# Patient Record
Sex: Female | Born: 1980 | Race: Black or African American | Hispanic: No | Marital: Single | Smoking: Current every day smoker
Health system: Southern US, Community
[De-identification: ages and names within clinical notes are randomized; demographics above are authoritative.]

## PROBLEM LIST (undated history)

## (undated) DIAGNOSIS — G43909 Migraine, unspecified, not intractable, without status migrainosus: Secondary | ICD-10-CM

## (undated) DIAGNOSIS — R319 Hematuria, unspecified: Secondary | ICD-10-CM

## (undated) DIAGNOSIS — R351 Nocturia: Secondary | ICD-10-CM

## (undated) DIAGNOSIS — N200 Calculus of kidney: Secondary | ICD-10-CM

## (undated) DIAGNOSIS — R3915 Urgency of urination: Secondary | ICD-10-CM

## (undated) DIAGNOSIS — Z87442 Personal history of urinary calculi: Secondary | ICD-10-CM

## (undated) DIAGNOSIS — N201 Calculus of ureter: Secondary | ICD-10-CM

## (undated) DIAGNOSIS — M3500 Sicca syndrome, unspecified: Secondary | ICD-10-CM

---

## 2003-04-21 HISTORY — PX: CHOLECYSTECTOMY: SHX55

## 2004-04-20 HISTORY — PX: TUBAL LIGATION: SHX77

## 2010-04-20 HISTORY — PX: EXTRACORPOREAL SHOCK WAVE LITHOTRIPSY: SHX1557

## 2013-02-28 ENCOUNTER — Emergency Department (HOSPITAL_COMMUNITY): Payer: Medicaid Other

## 2013-02-28 ENCOUNTER — Emergency Department (HOSPITAL_COMMUNITY)
Admission: EM | Admit: 2013-02-28 | Discharge: 2013-02-28 | Disposition: A | Discharge status: Home / Self Care | Payer: Medicaid Other | Attending: Emergency Medicine | Admitting: Emergency Medicine

## 2013-02-28 ENCOUNTER — Telehealth (HOSPITAL_COMMUNITY): Payer: Self-pay | Admitting: *Deleted

## 2013-02-28 ENCOUNTER — Encounter (HOSPITAL_COMMUNITY): Payer: Self-pay | Admitting: Emergency Medicine

## 2013-02-28 DIAGNOSIS — S82899A Other fracture of unspecified lower leg, initial encounter for closed fracture: Secondary | ICD-10-CM | POA: Insufficient documentation

## 2013-02-28 DIAGNOSIS — S82401A Unspecified fracture of shaft of right fibula, initial encounter for closed fracture: Secondary | ICD-10-CM

## 2013-02-28 DIAGNOSIS — X500XXA Overexertion from strenuous movement or load, initial encounter: Secondary | ICD-10-CM | POA: Insufficient documentation

## 2013-02-28 DIAGNOSIS — Y929 Unspecified place or not applicable: Secondary | ICD-10-CM | POA: Insufficient documentation

## 2013-02-28 DIAGNOSIS — S82301D Unspecified fracture of lower end of right tibia, subsequent encounter for closed fracture with routine healing: Secondary | ICD-10-CM

## 2013-02-28 DIAGNOSIS — Y9302 Activity, running: Secondary | ICD-10-CM | POA: Insufficient documentation

## 2013-02-28 DIAGNOSIS — F17200 Nicotine dependence, unspecified, uncomplicated: Secondary | ICD-10-CM | POA: Insufficient documentation

## 2013-02-28 MED ORDER — IBUPROFEN 400 MG PO TABS
600.0000 mg | ORAL_TABLET | Freq: Once | ORAL | Status: AC
  Administered 2013-02-28: 600 mg via ORAL
  Filled 2013-02-28 (×2): qty 1

## 2013-02-28 MED ORDER — IBUPROFEN 600 MG PO TABS: 600.0000 mg | ORAL_TABLET | Freq: Four times a day (QID) | ORAL | Status: DC | PRN

## 2013-02-28 NOTE — ED Provider Notes (Signed)
CSN: [id number]     Arrival date & time 02/28/13  0848 History   First MD Initiated Contact with Patient 02/28/13 0857     Chief Complaint  Patient presents with  . Foot Pain   (Consider location/radiation/quality/duration/timing/severity/associated sxs/prior Treatment) Patient is a 32 y.o. female presenting with ankle pain. The history is provided by the patient.  Ankle Pain Location:  Ankle Time since incident:  4 days Lower extremity injury: running and twisted ankle.   Ankle location:  R ankle Pain details:    Quality:  Dull   Radiates to:  Does not radiate   Severity:  Moderate   Onset quality:  Sudden   Duration:  4 days   Timing:  Intermittent   Progression:  Waxing and waning Chronicity:  New Dislocation: no   Foreign body present:  No foreign bodies Prior injury to area:  Yes ("small fracture in july") Relieved by:  Ice and immobilization Worsened by:  Bearing weight Ineffective treatments:  None tried Associated symptoms: decreased ROM and swelling   Associated symptoms: no fever, no muscle weakness, no numbness, no stiffness and no tingling   Risk factors: no concern for non-accidental trauma     History reviewed. No pertinent past medical history. Past Surgical History  Procedure Laterality Date  . Cholecystectomy    . Tubal ligation     No family history on file. History  Substance Use Topics  . Smoking status: Current Every Day Smoker  . Smokeless tobacco: Not on file  . Alcohol Use: No   OB History   Grav Para Term Preterm Abortions TAB SAB Ect Mult Living                 Review of Systems  Constitutional: Negative for fever.  Musculoskeletal: Negative for stiffness.  All other systems reviewed and are negative.    Allergies  Review of patient's allergies indicates no known allergies.  Home Medications   Current Outpatient Rx  Name  Route  Sig  Dispense  Refill  . ibuprofen (ADVIL,MOTRIN) 200 MG tablet   Oral   Take 600-800 mg by  mouth every 6 (six) hours as needed for headache or mild pain.         Marland Kitchen ibuprofen (ADVIL,MOTRIN) 600 MG tablet   Oral   Take 1 tablet (600 mg total) by mouth every 6 (six) hours as needed for mild pain.   30 tablet   0    BP 101/56  Pulse 99  Temp(Src) 98.5 F (36.9 C) (Oral)  Resp 16  Wt 161 lb 8 oz (73.256 kg)  SpO2 100%  LMP 02/28/2013 Physical Exam  Nursing note and vitals reviewed. Constitutional: She is oriented to person, place, and time. She appears well-developed and well-nourished.  HENT:  Head: Normocephalic.  Right Ear: External ear normal.  Left Ear: External ear normal.  Nose: Nose normal.  Mouth/Throat: Oropharynx is clear and moist.  Eyes: EOM are normal. Pupils are equal, round, and reactive to light. Right eye exhibits no discharge. Left eye exhibits no discharge.  Neck: Normal range of motion. Neck supple. No tracheal deviation present.  No nuchal rigidity no meningeal signs  Cardiovascular: Normal rate and regular rhythm.   Pulmonary/Chest: Effort normal and breath sounds normal. No stridor. No respiratory distress. She has no wheezes. She has no rales.  Abdominal: Soft. She exhibits no distension and no mass. There is no tenderness. There is no rebound and no guarding.  Musculoskeletal: Normal range of motion.  She exhibits edema and tenderness.  Tenderness and edema over right lateral malleolus extending one third of the way  up distal tibia. Neurovascularly intact distally. No metatarsal tenderness. Full range of motion at ankle knee and hip.  Neurological: She is alert and oriented to person, place, and time. She has normal reflexes. No cranial nerve deficit. Coordination normal.  Skin: Skin is warm. No rash noted. She is not diaphoretic. No erythema. No pallor.  No pettechia no purpura    ED Course  Procedures (including critical care time) Labs Review Labs Reviewed - No data to display Imaging Review Dg Ankle Complete Right  02/28/2013    CLINICAL DATA:  Right ankle pain and swelling after injury.  EXAM: RIGHT ANKLE - COMPLETE 3+ VIEW  COMPARISON:  None.  FINDINGS: There is a small triangular bone fragment seen distal to the right fibula most consistent with old fracture, but acute pathology cannot be. No soft tissue swelling is noted. Joint spaces appear to be intact.  IMPRESSION: Small triangular bone fragment seen distal to right fibula which may represent old fracture, but acute pathology cannot be excluded and correlation is recommended.   Electronically Signed   By: Roque Lias M.D.   On: 02/28/2013 09:39    EKG Interpretation   None       MDM   1. Fibula fracture, right, closed, initial encounter    Will obtain x-rays to rule out fracture dislocation we'll give Motrin for pain patient agrees with plan  1010a small bone fragment likely related to fracture that occurred during the summer that patient describes. Patient is unable to bear weight at this time due to pain. I will place patient in splint and crutches and have orthopedic followup patient remains neurovascularly intact. Patient agrees with plan for discharge home.    Arley Phenix, MD 02/28/13 [phone]

## 2013-02-28 NOTE — ED Notes (Signed)
Patient was running on Friday,  She twisted her ankle.  She now has bruising and pain when moving and ambulating.  Last took ibuprofen last night.

## 2013-02-28 NOTE — ED Notes (Signed)
Pt calling for Authorization # MDs name that she saw in the ED.  I called [facility] [place] office and they said to tell her to call her PCP or Social worker with medicaid.

## 2013-02-28 NOTE — Progress Notes (Signed)
Orthopedic Tech Progress Note Patient Details:  Christine Bautista 01-06-1981 [id number]  Ortho Devices Type of Ortho Device: Stirrup splint;Post (short leg) splint;Crutches Ortho Device/Splint Interventions: Application   Cammer, Mickie Bail 02/28/2013, 10:31 AM

## 2013-05-05 ENCOUNTER — Encounter (HOSPITAL_COMMUNITY)
Admission: EM | Disposition: A | Discharge status: Home / Self Care | Payer: Self-pay | Source: Home / Self Care | Attending: Emergency Medicine

## 2013-05-05 ENCOUNTER — Emergency Department (HOSPITAL_COMMUNITY): Payer: Medicaid Other

## 2013-05-05 ENCOUNTER — Encounter (HOSPITAL_COMMUNITY): Payer: Medicaid Other | Admitting: Anesthesiology

## 2013-05-05 ENCOUNTER — Ambulatory Visit (HOSPITAL_COMMUNITY)
Admission: EM | Admit: 2013-05-05 | Discharge: 2013-05-05 | Disposition: A | Discharge status: Home / Self Care | Payer: Medicaid Other | Attending: Emergency Medicine | Admitting: Emergency Medicine

## 2013-05-05 ENCOUNTER — Emergency Department (HOSPITAL_COMMUNITY): Payer: Medicaid Other | Admitting: Anesthesiology

## 2013-05-05 ENCOUNTER — Encounter (HOSPITAL_COMMUNITY): Payer: Self-pay | Admitting: Emergency Medicine

## 2013-05-05 DIAGNOSIS — Z87442 Personal history of urinary calculi: Secondary | ICD-10-CM | POA: Insufficient documentation

## 2013-05-05 DIAGNOSIS — Z9089 Acquired absence of other organs: Secondary | ICD-10-CM | POA: Insufficient documentation

## 2013-05-05 DIAGNOSIS — R109 Unspecified abdominal pain: Secondary | ICD-10-CM | POA: Insufficient documentation

## 2013-05-05 DIAGNOSIS — Q615 Medullary cystic kidney: Secondary | ICD-10-CM | POA: Insufficient documentation

## 2013-05-05 DIAGNOSIS — Z79899 Other long term (current) drug therapy: Secondary | ICD-10-CM | POA: Insufficient documentation

## 2013-05-05 DIAGNOSIS — N201 Calculus of ureter: Secondary | ICD-10-CM | POA: Insufficient documentation

## 2013-05-05 DIAGNOSIS — N200 Calculus of kidney: Secondary | ICD-10-CM

## 2013-05-05 DIAGNOSIS — F17200 Nicotine dependence, unspecified, uncomplicated: Secondary | ICD-10-CM | POA: Insufficient documentation

## 2013-05-05 DIAGNOSIS — G43909 Migraine, unspecified, not intractable, without status migrainosus: Secondary | ICD-10-CM | POA: Insufficient documentation

## 2013-05-05 DIAGNOSIS — N1330 Unspecified hydronephrosis: Secondary | ICD-10-CM | POA: Insufficient documentation

## 2013-05-05 DIAGNOSIS — Z9851 Tubal ligation status: Secondary | ICD-10-CM | POA: Insufficient documentation

## 2013-05-05 HISTORY — PX: CYSTOSCOPY W/ URETERAL STENT PLACEMENT: SHX1429

## 2013-05-05 HISTORY — DX: Migraine, unspecified, not intractable, without status migrainosus: G43.909

## 2013-05-05 LAB — URINALYSIS, ROUTINE W REFLEX MICROSCOPIC
BILIRUBIN URINE: NEGATIVE
Glucose, UA: NEGATIVE mg/dL
Ketones, ur: NEGATIVE mg/dL
Nitrite: NEGATIVE
PROTEIN: 30 mg/dL — AB
Specific Gravity, Urine: 1.015 (ref 1.005–1.030)
Urobilinogen, UA: 1 mg/dL (ref 0.0–1.0)
pH: 7 (ref 5.0–8.0)

## 2013-05-05 LAB — CBC WITH DIFFERENTIAL/PLATELET
BASOS ABS: 0 10*3/uL (ref 0.0–0.1)
Basophils Relative: 0 % (ref 0–1)
Eosinophils Absolute: 0.1 10*3/uL (ref 0.0–0.7)
Eosinophils Relative: 1 % (ref 0–5)
HEMATOCRIT: 39.5 % (ref 36.0–46.0)
Hemoglobin: 13.3 g/dL (ref 12.0–15.0)
LYMPHS PCT: 15 % (ref 12–46)
Lymphs Abs: 1.3 10*3/uL (ref 0.7–4.0)
MCH: 26 pg (ref 26.0–34.0)
MCHC: 33.7 g/dL (ref 30.0–36.0)
MCV: 77.1 fL — ABNORMAL LOW (ref 78.0–100.0)
MONO ABS: 0.4 10*3/uL (ref 0.1–1.0)
Monocytes Relative: 5 % (ref 3–12)
NEUTROS ABS: 6.8 10*3/uL (ref 1.7–7.7)
Neutrophils Relative %: 80 % — ABNORMAL HIGH (ref 43–77)
Platelets: 120 10*3/uL — ABNORMAL LOW (ref 150–400)
RBC: 5.12 MIL/uL — ABNORMAL HIGH (ref 3.87–5.11)
RDW: 13.7 % (ref 11.5–15.5)
WBC: 8.5 10*3/uL (ref 4.0–10.5)

## 2013-05-05 LAB — COMPREHENSIVE METABOLIC PANEL
ALK PHOS: 70 U/L (ref 39–117)
ALT: 52 U/L — AB (ref 0–35)
AST: 35 U/L (ref 0–37)
Albumin: 3.6 g/dL (ref 3.5–5.2)
BILIRUBIN TOTAL: 0.2 mg/dL — AB (ref 0.3–1.2)
BUN: 12 mg/dL (ref 6–23)
CHLORIDE: 101 meq/L (ref 96–112)
CO2: 26 meq/L (ref 19–32)
Calcium: 9.4 mg/dL (ref 8.4–10.5)
Creatinine, Ser: 0.96 mg/dL (ref 0.50–1.10)
GFR calc Af Amer: >90 mL/min (ref >90)
GFR, EST NON AFRICAN AMERICAN: 78 mL/min — AB (ref >90)
Glucose, Bld: 94 mg/dL (ref 70–99)
Potassium: 4.2 mEq/L (ref 3.7–5.3)
Sodium: 139 mEq/L (ref 137–147)
Total Protein: 8.2 g/dL (ref 6.0–8.3)

## 2013-05-05 LAB — URINE MICROSCOPIC-ADD ON

## 2013-05-05 LAB — LIPASE, BLOOD: Lipase: 21 U/L (ref 11–59)

## 2013-05-05 LAB — OCCULT BLOOD, POC DEVICE: Fecal Occult Bld: NEGATIVE

## 2013-05-05 LAB — POCT PREGNANCY, URINE: Preg Test, Ur: NEGATIVE

## 2013-05-05 SURGERY — CYSTOSCOPY, WITH RETROGRADE PYELOGRAM AND URETERAL STENT INSERTION
Anesthesia: General | Site: Ureter | Laterality: Left

## 2013-05-05 MED ORDER — ONDANSETRON HCL 4 MG/2ML IJ SOLN
4.0000 mg | Freq: Once | INTRAMUSCULAR | Status: AC
  Administered 2013-05-05: 4 mg via INTRAVENOUS
  Filled 2013-05-05: qty 2

## 2013-05-05 MED ORDER — MORPHINE SULFATE 4 MG/ML IJ SOLN
4.0000 mg | Freq: Once | INTRAMUSCULAR | Status: AC
  Administered 2013-05-05: 4 mg via INTRAVENOUS
  Filled 2013-05-05: qty 1

## 2013-05-05 MED ORDER — BELLADONNA ALKALOIDS-OPIUM 16.2-60 MG RE SUPP
RECTAL | Status: DC | PRN
  Administered 2013-05-05: 1 via RECTAL

## 2013-05-05 MED ORDER — LACTATED RINGERS IV SOLN: INTRAVENOUS | Status: DC

## 2013-05-05 MED ORDER — CEFAZOLIN SODIUM-DEXTROSE 2-3 GM-% IV SOLR
2.0000 g | INTRAVENOUS | Status: AC
  Administered 2013-05-05: 2 g via INTRAVENOUS

## 2013-05-05 MED ORDER — OXYCODONE-ACETAMINOPHEN 5-325 MG PO TABS
ORAL_TABLET | ORAL | Status: AC
  Filled 2013-05-05: qty 1

## 2013-05-05 MED ORDER — FENTANYL CITRATE 0.05 MG/ML IJ SOLN
INTRAMUSCULAR | Status: AC
  Filled 2013-05-05: qty 2

## 2013-05-05 MED ORDER — IOHEXOL 300 MG/ML  SOLN
INTRAMUSCULAR | Status: DC | PRN
  Administered 2013-05-05: 4 mL via INTRAVENOUS

## 2013-05-05 MED ORDER — OXYBUTYNIN CHLORIDE 5 MG PO TABS: 5.0000 mg | ORAL_TABLET | Freq: Four times a day (QID) | ORAL | Status: DC | PRN

## 2013-05-05 MED ORDER — MIDAZOLAM HCL 2 MG/2ML IJ SOLN
INTRAMUSCULAR | Status: AC
  Filled 2013-05-05: qty 2

## 2013-05-05 MED ORDER — ONDANSETRON HCL 4 MG/2ML IJ SOLN
INTRAMUSCULAR | Status: AC
  Filled 2013-05-05: qty 2

## 2013-05-05 MED ORDER — OXYBUTYNIN CHLORIDE 5 MG PO TABS
ORAL_TABLET | ORAL | Status: AC
  Filled 2013-05-05: qty 1

## 2013-05-05 MED ORDER — SODIUM CHLORIDE 0.9 % IV SOLN
1000.0000 mL | Freq: Once | INTRAVENOUS | Status: AC
  Administered 2013-05-05: 1000 mL via INTRAVENOUS

## 2013-05-05 MED ORDER — OXYCODONE-ACETAMINOPHEN 5-325 MG PO TABS
1.0000 | ORAL_TABLET | ORAL | Status: DC | PRN
  Administered 2013-05-05: 1 via ORAL

## 2013-05-05 MED ORDER — KETOROLAC TROMETHAMINE 30 MG/ML IJ SOLN
30.0000 mg | Freq: Once | INTRAMUSCULAR | Status: AC
  Administered 2013-05-05: 30 mg via INTRAVENOUS

## 2013-05-05 MED ORDER — CEFAZOLIN SODIUM-DEXTROSE 2-3 GM-% IV SOLR
INTRAVENOUS | Status: AC
  Filled 2013-05-05: qty 50

## 2013-05-05 MED ORDER — MIDAZOLAM HCL 5 MG/5ML IJ SOLN
INTRAMUSCULAR | Status: DC | PRN
  Administered 2013-05-05: 2 mg via INTRAVENOUS

## 2013-05-05 MED ORDER — TAMSULOSIN HCL 0.4 MG PO CAPS: 0.4000 mg | ORAL_CAPSULE | Freq: Every day | ORAL | Status: DC

## 2013-05-05 MED ORDER — CIPROFLOXACIN HCL 500 MG PO TABS: 500.0000 mg | ORAL_TABLET | Freq: Two times a day (BID) | ORAL | Status: DC

## 2013-05-05 MED ORDER — PHENYLEPHRINE 40 MCG/ML (10ML) SYRINGE FOR IV PUSH (FOR BLOOD PRESSURE SUPPORT)
PREFILLED_SYRINGE | INTRAVENOUS | Status: AC
  Filled 2013-05-05: qty 10

## 2013-05-05 MED ORDER — FENTANYL CITRATE 0.05 MG/ML IJ SOLN
25.0000 ug | INTRAMUSCULAR | Status: DC | PRN
  Administered 2013-05-05 (×2): 25 ug via INTRAVENOUS

## 2013-05-05 MED ORDER — ACETAMINOPHEN 10 MG/ML IV SOLN
1000.0000 mg | Freq: Once | INTRAVENOUS | Status: AC
  Administered 2013-05-05: 1000 mg via INTRAVENOUS
  Filled 2013-05-05: qty 100

## 2013-05-05 MED ORDER — FENTANYL CITRATE 0.05 MG/ML IJ SOLN
INTRAMUSCULAR | Status: DC | PRN
  Administered 2013-05-05: 50 ug via INTRAVENOUS

## 2013-05-05 MED ORDER — PHENAZOPYRIDINE HCL 100 MG PO TABS: 100.0000 mg | ORAL_TABLET | Freq: Three times a day (TID) | ORAL | Status: DC | PRN

## 2013-05-05 MED ORDER — ONDANSETRON HCL 4 MG/2ML IJ SOLN
INTRAMUSCULAR | Status: DC | PRN
  Administered 2013-05-05: 4 mg via INTRAVENOUS

## 2013-05-05 MED ORDER — HYOSCYAMINE SULFATE 0.125 MG PO TABS: 0.1250 mg | ORAL_TABLET | ORAL | Status: DC | PRN

## 2013-05-05 MED ORDER — OXYCODONE-ACETAMINOPHEN 5-325 MG PO TABS: 1.0000 | ORAL_TABLET | ORAL | Status: DC | PRN

## 2013-05-05 MED ORDER — BELLADONNA ALKALOIDS-OPIUM 16.2-60 MG RE SUPP
RECTAL | Status: AC
  Filled 2013-05-05: qty 1

## 2013-05-05 MED ORDER — SENNOSIDES-DOCUSATE SODIUM 8.6-50 MG PO TABS: 1.0000 | ORAL_TABLET | Freq: Two times a day (BID) | ORAL | Status: DC

## 2013-05-05 MED ORDER — KETOROLAC TROMETHAMINE 30 MG/ML IJ SOLN
INTRAMUSCULAR | Status: AC
  Filled 2013-05-05: qty 1

## 2013-05-05 MED ORDER — PROPOFOL 10 MG/ML IV BOLUS
INTRAVENOUS | Status: DC | PRN
Start: 2013-05-05 — End: 2013-05-05
  Administered 2013-05-05: 100 mg via INTRAVENOUS

## 2013-05-05 MED ORDER — SODIUM CHLORIDE 0.9 % IR SOLN
Status: DC | PRN
  Administered 2013-05-05: 3000 mL via INTRAVESICAL

## 2013-05-05 MED ORDER — LIDOCAINE HCL 2 % EX GEL
CUTANEOUS | Status: AC
  Filled 2013-05-05: qty 10

## 2013-05-05 MED ORDER — PROPOFOL 10 MG/ML IV BOLUS
INTRAVENOUS | Status: AC
  Filled 2013-05-05: qty 20

## 2013-05-05 MED ORDER — LIDOCAINE HCL 2 % EX GEL
CUTANEOUS | Status: DC | PRN
  Administered 2013-05-05: 1

## 2013-05-05 MED ORDER — OXYBUTYNIN CHLORIDE 5 MG PO TABS
5.0000 mg | ORAL_TABLET | Freq: Once | ORAL | Status: AC
  Administered 2013-05-05: 5 mg via ORAL

## 2013-05-05 MED ORDER — PHENYLEPHRINE HCL 10 MG/ML IJ SOLN
INTRAMUSCULAR | Status: DC | PRN
  Administered 2013-05-05: 60 ug via INTRAVENOUS

## 2013-05-05 MED ORDER — LACTATED RINGERS IV SOLN
INTRAVENOUS | Status: DC | PRN
  Administered 2013-05-05: 17:00:00 via INTRAVENOUS

## 2013-05-05 SURGICAL SUPPLY — 16 items
BAG URO CATCHER STRL LF (DRAPE) ×2 IMPLANT
BASKET ZERO TIP NITINOL 2.4FR (BASKET) IMPLANT
CATH INTERMIT  6FR 70CM (CATHETERS) IMPLANT
CATH URET 5FR 28IN OPEN ENDED (CATHETERS) ×2 IMPLANT
DRAPE CAMERA CLOSED 9X96 (DRAPES) ×2 IMPLANT
GLOVE BIOGEL M 7.0 STRL (GLOVE) ×2 IMPLANT
GLOVE BIOGEL PI IND STRL 7.0 (GLOVE) ×2 IMPLANT
GLOVE BIOGEL PI INDICATOR 7.0 (GLOVE) ×2
GOWN STRL REUS W/TWL LRG LVL3 (GOWN DISPOSABLE) ×2 IMPLANT
GUIDEWIRE ANG ZIPWIRE 038X150 (WIRE) IMPLANT
GUIDEWIRE STR DUAL SENSOR (WIRE) ×4 IMPLANT
MANIFOLD NEPTUNE II (INSTRUMENTS) ×2 IMPLANT
PACK CYSTO (CUSTOM PROCEDURE TRAY) ×2 IMPLANT
SCRUB PCMX 4 OZ (MISCELLANEOUS) ×2 IMPLANT
STENT POLARIS 5FRX24 (STENTS) ×2 IMPLANT
TUBING CONNECTING 10 (TUBING) ×2 IMPLANT

## 2013-05-05 NOTE — ED Provider Notes (Signed)
CSN: [id number]     Arrival date & time 05/05/13  0944 History   First MD Initiated Contact with Patient 05/05/13 1008     Chief Complaint  Patient presents with  . flu like symptoms    (Consider location/radiation/quality/duration/timing/severity/associated sxs/prior Treatment) HPI  33 year old female with history of migraine, history of pancreatitis and hx of Sjogren syndrome who presents to the ED complaining of "not feeling well". Patient reports she normally has migraine once every 2 weeks which she takes Maxalt. She recently moved to this area and recently established care with a new provider. This Monday she developed pain to the back of the head and neck, which is usual for migraine. She followup with her new provider after Maxalt is not providing relief. Patient was given Phenergan and Toradol shot which has helped with the headache. She was recommended to take Topamax daily for headache. However throughout the week she developed body aches, feeling tired, sleepy, headache, nausea and has vomited to 3 times of yellow stomach contents. She also reports not having any bowel movement for the past 3 days. She normally would have 2-3 bouts of bowel movement per day. She denies having any URI symptoms including no running nose sneezing coughing. She denies chest pain or shortness of breath. Denies abdominal pain or constipation. Decrease in appetite. Patient reports due to the symptoms her work place has sent her home, therefore she would like to be evaluated to find out the cause of her sxs.  She also report having a recent strep test and flu test within the past several days but it was negative.  No hx of alcohol abuse or diabetes.    Past Medical History  Diagnosis Date  . Migraines    Past Surgical History  Procedure Laterality Date  . Cholecystectomy    . Tubal ligation     No family history on file. History  Substance Use Topics  . Smoking status: Current Every Day Smoker  .  Smokeless tobacco: Not on file  . Alcohol Use: No   OB History   Grav Para Term Preterm Abortions TAB SAB Ect Mult Living                 Review of Systems  Constitutional: Negative for fever.  All other systems reviewed and are negative.    Allergies  Review of patient's allergies indicates no known allergies.  Home Medications   Current Outpatient Rx  Name  Route  Sig  Dispense  Refill  . aspirin-acetaminophen-caffeine (EXCEDRIN MIGRAINE) 250-250-65 MG per tablet   Oral   Take 2 tablets by mouth every 6 (six) hours as needed for headache.         . ibuprofen (ADVIL,MOTRIN) 200 MG tablet   Oral   Take 600-800 mg by mouth every 6 (six) hours as needed for headache or mild pain.         . promethazine (PHENERGAN) 25 MG tablet   Oral   Take 25 mg by mouth every 6 (six) hours as needed for nausea.          . rizatriptan (MAXALT) 10 MG tablet   Oral   Take 10 mg by mouth as needed for migraine. May repeat in 2 hours if needed         . SUMAtriptan (IMITREX) 100 MG tablet   Oral   Take 100 mg by mouth daily as needed for migraine.          . topiramate (TOPAMAX)  25 MG tablet   Oral   Take 1 tablet by mouth 2 (two) times daily.         . traMADol (ULTRAM) 50 MG tablet   Oral   Take 50 mg by mouth every 6 (six) hours as needed for moderate pain.           BP 126/75  Pulse 108  Temp(Src) 98.5 F (36.9 C) (Oral)  Resp 18  SpO2 100%  LMP 04/18/2013 Physical Exam  Nursing note and vitals reviewed. Constitutional: She appears well-developed and well-nourished. No distress.  HENT:  Head: Atraumatic.  Ears: Normal TMs bilaterally  Nose: normal nares  Throat: uvula midline, no trismus, no evidence of deep tissue infection. Normal tonsils.      Eyes: Conjunctivae are normal.  Neck: Neck supple.  No nuchal rigidity  Cardiovascular:  Tachycardia without murmurs, rubs, or gallops noted  Pulmonary/Chest: Effort normal and breath sounds normal.   Abdominal: She exhibits no distension. There is tenderness (Mild generalized abdominal discomfort most significant to the epigastric and right upper quadrant without rebound ). There is no rebound and no guarding.  Genitourinary:  L CVA tenderness  Neurological: She is alert.  Skin: No rash noted.  Psychiatric: She has a normal mood and affect.    ED Course  Procedures (including critical care time)  10:40 AM Patient here with generalized body aches and symptoms of viral infection but no significant URI symptoms. No acute onset thunderclap headache concerning for subarachnoid hemorrhage, no focal neuro deficit concerning for stroke, no fever, nuchal rigidity, or meningismal sign concerning for meningitis. Patient does report having nausea and vomiting fall with constipation. She has had prior abdominal surgery including cholecystectomy and tubal ligation. Will obtain acute abdominal series to rule out small bowel obstruction. We'll give IV fluid, that her nausea and pain medication and we'll check labs.  10:48 AM Pt report hx of pancreatitis with unknown sources (no hx of alcohol abuse, no hx of diabetes, no medication changes).  Does report hx of Sjogren, which i suspect may be related to her pancreatitis.    3:40 PM Pt with hx of kidney stones as well, requiring lithotripsy in the past.  She does have L CVA tenderness and blood in her urine with many bacteria.  CT shows 4 calculi within the proximal to mid left ureter, largest measuring 6.315mm with moderate obstructive uropathy.  Will consult urology for further management.    3:50 PM I have consulted with urologist Dr. Margarita GrizzleWoodruff, who agrees to see pt in ER, and likely treat pt with stenting.  Last meal was last night.  Will make pt npo.    4:12 PM Dr. Margarita GrizzleWoodruff has seen and evaluate pt.  He will take pt to the OR in 2-3 hrs for stenting and will dispo pt afterward.  He does not think pt warranted an admission at this time and request to  continue with care management until stenting.  Will continue to monitor pt.     Labs Review Labs Reviewed  CBC WITH DIFFERENTIAL - Abnormal; Notable for the following:    RBC 5.12 (*)    MCV 77.1 (*)    Platelets 120 (*)    Neutrophils Relative % 80 (*)    All other components within normal limits  COMPREHENSIVE METABOLIC PANEL - Abnormal; Notable for the following:    ALT 52 (*)    Total Bilirubin 0.2 (*)    GFR calc non Af Amer 78 (*)  All other components within normal limits  URINALYSIS, ROUTINE W REFLEX MICROSCOPIC - Abnormal; Notable for the following:    APPearance CLOUDY (*)    Hgb urine dipstick LARGE (*)    Protein, ur 30 (*)    Leukocytes, UA TRACE (*)    All other components within normal limits  URINE MICROSCOPIC-ADD ON - Abnormal; Notable for the following:    Squamous Epithelial / LPF FEW (*)    Bacteria, UA MANY (*)    All other components within normal limits  URINE CULTURE  LIPASE, BLOOD  POCT PREGNANCY, URINE  OCCULT BLOOD, POC DEVICE   Imaging Review Ct Abdomen Pelvis Wo Contrast  05/05/2013   CLINICAL DATA:  Left flank pain.  EXAM: CT ABDOMEN AND PELVIS WITHOUT CONTRAST  TECHNIQUE: Multidetector CT imaging of the abdomen and pelvis was performed following the standard protocol without intravenous contrast.  COMPARISON:  None.  FINDINGS: Hypoventilation is appreciated within the lung bases.  Evaluation of the left kidney demonstrates diffuse regions of medullary calcinosis. Moderate hydronephrosis is identified. Within the proximal left ureter, approximately 4 calculi are identified. The largest focus appears to measure 6.5 mm in craniocaudal dimensions image 79 series 5. The ureter is decompressed distal to the calculi. Regions of medullary calcinosis are identified within the right kidney no evidence of hydronephrosis nor ureteral lithiasis. Small punctate medullary calculi are also identified within the right kidney largest in the anterior lower pole  measuring 3.8 mm in diameter.  Noncontrast evaluation of the liver, spleen, adrenals, pancreas is unremarkable.  There is no evidence of abdominal or pelvic free fluid, loculated fluid collections, masses, nor adenopathy. There is no evidence of bowel obstruction, enteritis, colitis, nor diverticulitis. Unenhanced CT was performed per clinician order. Lack of IV contrast limits sensitivity and specificity, especially for evaluation of abdominal/pelvic solid viscera.  Patient status post cholecystectomy.  There is no evidence of abdominal aortic aneurysm.  Findings consistent with patient's history of bilateral tubal ligation within the pelvis. Surgical clips present.  IMPRESSION: 1. Approximately 4 calculi within the proximal to mid left ureter, largest focus measuring 6.5 mm in craniocaudal dimensions, with associated moderate obstructive uropathy. 2. 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   Electronically Signed   By: Salome Holmes M.D.   On: 05/05/2013 13:33   Dg Abd Acute W/chest  05/05/2013   CLINICAL DATA:  Rule out small bowel obstruction  EXAM: ACUTE ABDOMEN SERIES (ABDOMEN 2 VIEW & CHEST 1 VIEW)  COMPARISON:  None.  FINDINGS: The lungs are clear.  Heart size is normal.  No effusion  Normal bowel gas pattern. Mild amount of stool throughout the colon. No air-fluid levels or free air.  Surgical clips in the gallbladder fossa. Bilateral adnexal clips are present.  5 x 7 mm calcification to the left of L3-4 could be within the left ureter. Correlate with left flank pain. Right upper quadrant calcifications may be in the right kidney or surrounding soft tissues.  IMPRESSION: No active cardiopulmonary abnormality.  Negative for bowel obstruction.  5 x 7 mm calcification possibly in the mid left ureter.   Electronically Signed   By: Marlan Palau M.D.   On: 05/05/2013 11:39    EKG Interpretation   None       MDM   1. Kidney stone on left side    BP 121/84   Pulse 83  Temp(Src) 98.5 F (36.9 C) (Oral)  Resp 14  SpO2 100%  LMP 04/18/2013  I  have reviewed nursing notes and vital signs. I personally reviewed the imaging tests through PACS system  I reviewed available ER/hospitalization records thought the EMR     Fayrene Helper, [place] 05/06/13 1008

## 2013-05-05 NOTE — ED Notes (Signed)
Bed: WA03 Expected date:  Expected time:  Means of arrival:  Comments: 

## 2013-05-05 NOTE — Discharge Instructions (Signed)
DISCHARGE INSTRUCTIONS FOR KIDNEY STONES OR URETERAL STENT ° °MEDICATIONS:  ° °1.  Resume all your other meds from home. ° °ACTIVITY °1. No strenuous activity x 1week °2. No driving while on narcotic pain medications °3. Drink plenty of water °4. Continue to walk at home - you can still get blood clots when you are at home, so keep active, but don't over do it. °5. May return to work in 3 days. ° °BATHING °1. You can shower or take a bath. ° ° ° °SIGNS/SYMPTOMS TO CALL: °1. Please call us if you have a fever greater than 101.5, uncontrolled  °nausea/vomiting, uncontrolled pain, dizziness, unable to urinate, chest pain, shortness of breath, leg swelling, leg pain, redness around wound, drainage from wound, or any other concerns or questions. ° °You can reach us at [phone]. ° ° °

## 2013-05-05 NOTE — H&P (Signed)
Urology H&P (Consult)  Requesting provider:  Fayrene HelperBowie Tran, PA  CC: Left ureter stones.  HPI: 33 year old female presents to the ER with left ureter stones. This is associated with left flank pain. It is also associated with severe nausea. She denies fever or gross hematuria. She has a history of kidney stones and the images appear consistent with someone with medullary sponge kidney. The patient states that she has been told she has medullary sponge kidney. She's been treated for stones before with shockwave lithotripsy. She recently moved to this area. UA does not look concerning for infection, but a culture has been sent just to be sure. She had a CT scan of the abdomen and pelvis without contrast after was confirmed that her pregnancy test was negative. This reveals calcifications in the kidneys bilaterally. There is left hydroureter with proximal hydronephrosis. There released 2 stones in the left ureter. The largest is proximally 7 mm in size.  Today we discussed the management of obstructing urinary stones. These options include observation with pain control, percutaneous nephrostomy tube, or cystoscopy with retrograde ureteral stent placement with possible ureteroscopy and laser lithotripsy.  We discussed the risks, benefits, alternatives and likelihood of achieving the patient's goals.  We discussed which options are relevant to this particular situation. We discussed the natural history of stones and the as well as the complications of untreated stones and the impact on quality of life without treatment as well as with each of the above listed treatments. We also discussed the efficacy of each treatment. With any of these management options I discussed the signs and symptoms of infection and the need for emergent treatment should these be experienced.   For observation I described the risks which include, but are not limited to, silent renal damage, life-threatening infection, need for emergent  surgery, failure to pass stone, and pain.  For stent placement with or without ureteroscopy and lithotripsy I described the risks which include, but are not limited to, heart attack, stroke, pulmonary embolus, death, bleeding, infection, damage to contiguous structures, positioning injury, ureteral stricture, ureteral avulsion, ureteral injury, need for ureteral stent, inability to perform ureteroscopy, need for an interval procedure, inability to clear stone burden, stent discomfort and pain.  For percutaneous nephrostomy tube I described the risks which including, but are not limited to, heart attack, stroke, pulmonary embolus, death, arterial venous fistula or malformation, need for embolization of kidney, loss of kidney or renal function.  The patient had the opportunity to ask questions to their stated satisfaction and voiced understanding.   Regarding her options for definitive treatment of the left ureter stones we discussed several options.  Today we discussed the management of urinary stones. These options include observation, ureteroscopy, shockwave lithotripsy, and PCNL. We discussed which options are relevant to these particular stones. We discussed the natural history of stones as well as the complications of untreated stones and the impact on quality of life without treatment as well as with each of the above listed treatments. We also discussed the efficacy of each treatment in its ability to clear the stone burden. With any of these management options I discussed the signs and symptoms of infection and the need for emergent treatment should these be experienced. For each option we discussed the ability of each procedure to clear the patient of their stone burden.  For observation I described the risks which include but are not limited to silent renal damage, life-threatening infection, need for emergent surgery, failure to pass  stone, and pain.  For ureteroscopy I described the risks  which include heart attack, stroke, pulmonary embolus, death, bleeding, infection, damage to contiguous structures, positioning injury, ureteral stricture, ureteral avulsion, ureteral injury, need for ureteral stent, inability to perform ureteroscopy, need for an interval procedure, inability to clear stone burden, stent discomfort and pain.  For shockwave lithotripsy I described the risks which include arrhythmia, kidney contusion, kidney hemorrhage, need for transfusion, long-term risk of diabetes or hypertension, back discomfort, flank ecchymosis, flank abrasion, inability to break up stone, inability to pass stone fragments, Steinstrasse, infection associated with obstructing stones, need for different surgical procedure, need for repeat shockwave lithotripsy, and death.    PMH: Past Medical History  Diagnosis Date  . Migraines     PSH: Past Surgical History  Procedure Laterality Date  . Cholecystectomy    . Tubal ligation      Allergies: No Known Allergies  Medications:  (Not in a hospital admission)   Social History: History   Social History  . Marital Status: Single    Spouse Name: N/A    Number of Children: N/A  . Years of Education: N/A   Occupational History  . Not on file.   Social History Main Topics  . Smoking status: Current Every Day Smoker  . Smokeless tobacco: Not on file  . Alcohol Use: No  . Drug Use: No  . Sexual Activity: Not on file   Other Topics Concern  . Not on file   Social History Narrative  . No narrative on file    Family History: No family history on file.  Review of Systems: Positive: Nausea, emesis, HA, body ache. Negative: Chest pain, SOB, fever.  A further 10 point review of systems was negative except what is listed in the HPI.  Physical Exam: Filed Vitals:   05/05/13 1626  BP: 104/75  Pulse: 86  Temp:   Resp: 14    General: No acute distress.  Awake. Head:  Normocephalic.  Atraumatic. ENT:  EOMI.  Mucous  membranes moist Neck:  Supple.  No lymphadenopathy. CV:  S1 present. S2 present. Regular rate. Pulmonary: Equal effort bilaterally.  Clear to auscultation bilaterally. Abdomen: Soft.  Non- tender to palpation. Skin:  Normal turgor.  No visible rash. Extremity: No gross deformity of bilateral upper extremities.  No gross deformity of    bilateral lower extremities. Neurologic: Alert. Appropriate mood.    Studies:  Recent Labs     05/05/13  1045  HGB  13.3  WBC  8.5  PLT  120*    Recent Labs     05/05/13  1045  NA  139  K  4.2  CL  101  CO2  26  BUN  12  CREATININE  0.96  CALCIUM  9.4  GFRNONAA  78*  GFRAA  >90     No results found for this basename: PT, INR, APTT,  in the last 72 hours   No components found with this basename: ABG,     Assessment:  Left ureter stones.  Plan: To OR for cystoscopy, left retrograde pyelogram, left ureter stent placement.  Ancef on call to OR.  Given her history of possible bleeding disorder I recommend cystoscopy, left ureteroscopy, laser lithotripsy, left retrograde PolyGram, and left ureter stent exchange in an interval fashion. We have discussed the risk and benefits as listed in the history of present illness.    Pager: [phone]    CC: Fayrene Helper, PA

## 2013-05-05 NOTE — Transfer of Care (Signed)
Immediate Anesthesia Transfer of Care Note  Patient: Christine Bautista  Procedure(s) Performed: Procedure(s): CYSTOSCOPY WITH RETROGRADE PYELOGRAM/URETERAL STENT PLACEMENT LEFT (Left)  Patient Location: PACU  Anesthesia Type:General  Level of Consciousness: awake, oriented, sedated and patient cooperative  Airway & Oxygen Therapy: Patient Spontanous Breathing and Patient connected to face mask oxygen  Post-op Assessment: Report given to PACU RN and Post -op Vital signs reviewed and stable  Post vital signs: Reviewed and stable  Complications: No apparent anesthesia complications

## 2013-05-05 NOTE — ED Notes (Addendum)
Per pt, doesn't feel like self-body aches, nausea, headache, vomiting-no BM since Tues, no fever-saw PCP on Tuesday and was treated for migraine

## 2013-05-05 NOTE — Op Note (Signed)
Urology Operative Report  Date of Procedure: 05/05/13  Surgeon: Natalia Leatherwoodaniel Maximos Zayas, MD Assistant:  None  Preoperative Diagnosis: Left ureter stones. Postoperative Diagnosis:  Same  Procedure(s): Left ureter stent placement (5 x 24) Left retrograde pyelogram with interpretation. Cystoscopy  Estimated blood loss: None  Specimen: None  Drains: None  Complications: None  Findings: Impacted mid to proximal left ureter stones. Negative bladder tumors.  History of present illness: Patient with a history of kidney stones. Presents to the ER with left-sided flank pain. CT revealed multiple stones in the proximal mid ureter. She elected to have a stent placement today.   Procedure in detail: After informed consent was obtained, the patient was taken to the operating room. They were placed in the supine position. SCDs were turned on and in place. IV antibiotics were infused, and general anesthesia was induced. A timeout was performed in which the correct patient, surgical site, and procedure were identified and agreed upon by the team.  The patient was placed in a dorsolithotomy position, making sure to pad all pertinent neurovascular pressure points. The genitals were prepped and draped in the usual sterile fashion.  A rigid cystoscope was advanced through the urethra and into the bladder. The bladder was then drained. The bladder was then fully distended and evaluated in a systematic fashion to visualize the entire surface of the bladder. This was negative for bladder tumors.  Attention was turned to the left ureter orifice. I did not note any effluxed from this orifice. It was cannulated with a 5 [place] ureter catheter and I injected 20 cc of Omnipaque to obtain a retrograde pyelogram. The ureter filled out well to the mid proximal ureter where there was a filling defect. There was very little passage of contrast beyond this but eventually contrast did pass. It would not fill out the entire  collecting system but there appeared to be hydronephrosis with a distended renal pelvis.  I then placed a sensor wire up the left ureter, beyond the stone, and into the left renal pelvis on fluoroscopy. I then placed a 5 x 24 Polaris stent without the strings over the wire. This was placed with ease. A good curl was deployed in the left renal pelvis and the loops were noted in the bladder. The bladder was then drained.  I placed lidocaine jelly into the urethra and then a belladonna and opium suppository to the rectum. She's placed back in a supine position, anesthesia was reversed, and [facility]  All counts were correct at the end of the case.  I will give her ciprofloxacin to cover for this mentation. She will be scheduled to return for ureteroscopy and 1-2 weeks.

## 2013-05-05 NOTE — Anesthesia Postprocedure Evaluation (Signed)
  Anesthesia Post-op Note  Patient: Christine PointerMaurissa Foote  Procedure(s) Performed: Procedure(s) (LRB): CYSTOSCOPY WITH RETROGRADE PYELOGRAM/URETERAL STENT PLACEMENT LEFT (Left)  Patient Location: PACU  Anesthesia Type: General  Level of Consciousness: awake and alert   Airway and Oxygen Therapy: Patient Spontanous Breathing  Post-op Pain: mild  Post-op Assessment: Post-op Vital signs reviewed, Patient's Cardiovascular Status Stable, Respiratory Function Stable, Patent Airway and No signs of Nausea or vomiting  Last Vitals:  Filed Vitals:   05/05/13 1915  BP: 104/79  Pulse: 57  Temp: 36.7 C  Resp: 14    Post-op Vital Signs: stable   Complications: No apparent anesthesia complications

## 2013-05-05 NOTE — ED Notes (Signed)
Pt transported to OR

## 2013-05-05 NOTE — Anesthesia Preprocedure Evaluation (Signed)
Anesthesia Evaluation  Patient identified by MRN, date of birth, ID band Patient awake    Reviewed: Allergy & Precautions, H&P , NPO status , Patient's Chart, lab work & pertinent test results  Airway Mallampati: II TM Distance: >3 FB Neck ROM: Full    Dental no notable dental hx. (+) Teeth Intact and Dental Advisory Given   Pulmonary neg pulmonary ROS, Current Smoker,  breath sounds clear to auscultation  Pulmonary exam normal       Cardiovascular Exercise Tolerance: Good negative cardio ROS  Rhythm:Regular Rate:Normal     Neuro/Psych  Headaches, negative neurological ROS  negative psych ROS   GI/Hepatic negative GI ROS, Neg liver ROS,   Endo/Other  negative endocrine ROS  Renal/GU negative Renal ROS  negative genitourinary   Musculoskeletal   Abdominal   Peds  Hematology negative hematology ROS (+)   Anesthesia Other Findings   Reproductive/Obstetrics negative OB ROS                           Anesthesia Physical Anesthesia Plan  ASA: II  Anesthesia Plan: General   Post-op Pain Management:    Induction: Intravenous  Airway Management Planned: LMA  Additional Equipment:   Intra-op Plan:   Post-operative Plan:   Informed Consent: I have reviewed the patients History and Physical, chart, labs and discussed the procedure including the risks, benefits and alternatives for the proposed anesthesia with the patient or authorized representative who has indicated his/her understanding and acceptance.   Dental Advisory Given  Plan Discussed with: Surgeon  Anesthesia Plan Comments:         Anesthesia Quick Evaluation

## 2013-05-06 LAB — URINE CULTURE

## 2013-05-08 ENCOUNTER — Emergency Department (HOSPITAL_COMMUNITY): Payer: Medicaid Other

## 2013-05-08 ENCOUNTER — Encounter (HOSPITAL_COMMUNITY): Payer: Self-pay | Admitting: Urology

## 2013-05-08 ENCOUNTER — Emergency Department (HOSPITAL_COMMUNITY)
Admission: EM | Admit: 2013-05-08 | Discharge: 2013-05-08 | Disposition: A | Discharge status: Home / Self Care | Payer: Medicaid Other | Attending: Emergency Medicine | Admitting: Emergency Medicine

## 2013-05-08 DIAGNOSIS — R110 Nausea: Secondary | ICD-10-CM | POA: Insufficient documentation

## 2013-05-08 DIAGNOSIS — Z79899 Other long term (current) drug therapy: Secondary | ICD-10-CM | POA: Insufficient documentation

## 2013-05-08 DIAGNOSIS — Z8739 Personal history of other diseases of the musculoskeletal system and connective tissue: Secondary | ICD-10-CM | POA: Insufficient documentation

## 2013-05-08 DIAGNOSIS — G43909 Migraine, unspecified, not intractable, without status migrainosus: Secondary | ICD-10-CM | POA: Insufficient documentation

## 2013-05-08 DIAGNOSIS — F17200 Nicotine dependence, unspecified, uncomplicated: Secondary | ICD-10-CM | POA: Insufficient documentation

## 2013-05-08 DIAGNOSIS — J029 Acute pharyngitis, unspecified: Secondary | ICD-10-CM | POA: Insufficient documentation

## 2013-05-08 DIAGNOSIS — Z792 Long term (current) use of antibiotics: Secondary | ICD-10-CM | POA: Insufficient documentation

## 2013-05-08 DIAGNOSIS — Z9889 Other specified postprocedural states: Secondary | ICD-10-CM | POA: Insufficient documentation

## 2013-05-08 DIAGNOSIS — Z9851 Tubal ligation status: Secondary | ICD-10-CM | POA: Insufficient documentation

## 2013-05-08 DIAGNOSIS — N201 Calculus of ureter: Secondary | ICD-10-CM | POA: Insufficient documentation

## 2013-05-08 DIAGNOSIS — Z9089 Acquired absence of other organs: Secondary | ICD-10-CM | POA: Insufficient documentation

## 2013-05-08 DIAGNOSIS — R319 Hematuria, unspecified: Secondary | ICD-10-CM | POA: Insufficient documentation

## 2013-05-08 HISTORY — DX: Sjogren syndrome, unspecified: M35.00

## 2013-05-08 LAB — URINALYSIS, ROUTINE W REFLEX MICROSCOPIC
BILIRUBIN URINE: NEGATIVE
Glucose, UA: NEGATIVE mg/dL
KETONES UR: NEGATIVE mg/dL
NITRITE: NEGATIVE
Protein, ur: 100 mg/dL — AB
SPECIFIC GRAVITY, URINE: 1.012 (ref 1.005–1.030)
UROBILINOGEN UA: 1 mg/dL (ref 0.0–1.0)
pH: 7 (ref 5.0–8.0)

## 2013-05-08 LAB — CBC WITH DIFFERENTIAL/PLATELET
BASOS ABS: 0 10*3/uL (ref 0.0–0.1)
BASOS PCT: 0 % (ref 0–1)
Eosinophils Absolute: 0.1 10*3/uL (ref 0.0–0.7)
Eosinophils Relative: 1 % (ref 0–5)
HCT: 34.6 % — ABNORMAL LOW (ref 36.0–46.0)
Hemoglobin: 11.6 g/dL — ABNORMAL LOW (ref 12.0–15.0)
Lymphocytes Relative: 16 % (ref 12–46)
Lymphs Abs: 1 10*3/uL (ref 0.7–4.0)
MCH: 26.1 pg (ref 26.0–34.0)
MCHC: 33.5 g/dL (ref 30.0–36.0)
MCV: 77.8 fL — ABNORMAL LOW (ref 78.0–100.0)
Monocytes Absolute: 0.3 10*3/uL (ref 0.1–1.0)
Monocytes Relative: 5 % (ref 3–12)
NEUTROS PCT: 78 % — AB (ref 43–77)
Neutro Abs: 4.8 10*3/uL (ref 1.7–7.7)
PLATELETS: 135 10*3/uL — AB (ref 150–400)
RBC: 4.45 MIL/uL (ref 3.87–5.11)
RDW: 13.6 % (ref 11.5–15.5)
WBC: 6.2 10*3/uL (ref 4.0–10.5)

## 2013-05-08 LAB — POCT I-STAT, CHEM 8
BUN: 10 mg/dL (ref 6–23)
Calcium, Ion: 1.2 mmol/L (ref 1.12–1.23)
Chloride: 102 mEq/L (ref 96–112)
Creatinine, Ser: 0.9 mg/dL (ref 0.50–1.10)
Glucose, Bld: 92 mg/dL (ref 70–99)
HEMATOCRIT: 36 % (ref 36.0–46.0)
HEMOGLOBIN: 12.2 g/dL (ref 12.0–15.0)
Potassium: 3.5 mEq/L — ABNORMAL LOW (ref 3.7–5.3)
SODIUM: 140 meq/L (ref 137–147)
TCO2: 25 mmol/L (ref 0–100)

## 2013-05-08 LAB — URINE MICROSCOPIC-ADD ON

## 2013-05-08 LAB — RAPID STREP SCREEN (MED CTR MEBANE ONLY): STREPTOCOCCUS, GROUP A SCREEN (DIRECT): NEGATIVE

## 2013-05-08 MED ORDER — HYDROMORPHONE HCL PF 1 MG/ML IJ SOLN
1.0000 mg | Freq: Once | INTRAMUSCULAR | Status: AC
  Administered 2013-05-08: 1 mg via INTRAVENOUS
  Filled 2013-05-08: qty 1

## 2013-05-08 MED ORDER — CIPROFLOXACIN HCL 500 MG PO TABS: 500.0000 mg | ORAL_TABLET | Freq: Two times a day (BID) | ORAL | Status: DC

## 2013-05-08 MED ORDER — HYDROMORPHONE HCL 2 MG PO TABS: 2.0000 mg | ORAL_TABLET | ORAL | Status: DC | PRN

## 2013-05-08 MED ORDER — SODIUM CHLORIDE 0.9 % IV BOLUS (SEPSIS)
500.0000 mL | Freq: Once | INTRAVENOUS | Status: AC
  Administered 2013-05-08: 500 mL via INTRAVENOUS

## 2013-05-08 MED ORDER — ONDANSETRON HCL 4 MG/2ML IJ SOLN
4.0000 mg | Freq: Once | INTRAMUSCULAR | Status: AC
  Administered 2013-05-08: 4 mg via INTRAVENOUS
  Filled 2013-05-08: qty 2

## 2013-05-08 NOTE — ED Provider Notes (Signed)
CSN: [id number]     Arrival date & time 05/08/13  1657 History   First MD Initiated Contact with Patient 05/08/13 1737     Chief Complaint  Patient presents with  . Hematuria  . Nephrolithiasis   (Consider location/radiation/quality/duration/timing/severity/associated sxs/prior Treatment) Patient is a 33 y.o. female presenting with hematuria. The history is provided by the patient.  Hematuria This is a new problem. Pertinent negatives include no chest pain, no abdominal pain, no headaches and no shortness of breath.   patient had a left-sided ureteral stone and had a stent placed 3 days ago. She states she's been doing worse since. She states she's had blood in the urine including blood clots. States she's had left flank and lower abdominal pain. She states it also hurts to swallow. She states she feels worse than she did before the procedure. She denies fevers. She's had mild nausea. She states she was told to be able to go back to work and does not feel it should be noted that now. She states she feels some cramping she feels maybe in her vagina or in that area.  Past Medical History  Diagnosis Date  . Migraines   . Kidney stones   . Blood disorder   . Sjogren's disease   . Pancreatitis    Past Surgical History  Procedure Laterality Date  . Cholecystectomy    . Tubal ligation    . Cystoscopy w/ ureteral stent placement Left 05/05/2013    Procedure: CYSTOSCOPY WITH RETROGRADE PYELOGRAM/URETERAL STENT PLACEMENT LEFT;  Surgeon: Milford Cage, MD;  Location: [facility];  Service: Urology;  Laterality: Left;   Family History  Problem Relation Age of Onset  . Hepatitis C Mother   . Hypertension Father   . Hyperlipidemia Father   . Asthma Brother    History  Substance Use Topics  . Smoking status: Current Every Day Smoker    Types: Cigars  . Smokeless tobacco: Never Used  . Alcohol Use: No   OB History   Grav Para Term Preterm Abortions TAB SAB Ect Mult Living                  Review of Systems  Constitutional: Negative for activity change and appetite change.  HENT: Positive for sore throat and trouble swallowing.   Eyes: Negative for pain.  Respiratory: Negative for chest tightness and shortness of breath.   Cardiovascular: Negative for chest pain and leg swelling.  Gastrointestinal: Positive for nausea. Negative for vomiting, abdominal pain and diarrhea.  Genitourinary: Positive for hematuria and flank pain. Negative for vaginal bleeding.  Musculoskeletal: Negative for back pain and neck stiffness.  Skin: Negative for rash.  Neurological: Negative for weakness, numbness and headaches.  Psychiatric/Behavioral: Negative for behavioral problems.    Allergies  Review of patient's allergies indicates no known allergies.  Home Medications   Current Outpatient Rx  Name  Route  Sig  Dispense  Refill  . aspirin-acetaminophen-caffeine (EXCEDRIN MIGRAINE) 250-250-65 MG per tablet   Oral   Take 2 tablets by mouth every 6 (six) hours as needed for headache.         . hyoscyamine (LEVSIN, ANASPAZ) 0.125 MG tablet   Oral   Take 1 tablet (0.125 mg total) by mouth every 4 (four) hours as needed (bladder spasms).   40 tablet   4   . ibuprofen (ADVIL,MOTRIN) 200 MG tablet   Oral   Take 600-800 mg by mouth every 6 (six) hours as needed for headache or  mild pain.         Marland Kitchen oxybutynin (DITROPAN) 5 MG tablet   Oral   Take 1 tablet (5 mg total) by mouth every 6 (six) hours as needed for bladder spasms.   40 tablet   4   . oxyCODONE-acetaminophen (PERCOCET) 5-325 MG per tablet   Oral   Take 1-2 tablets by mouth every 4 (four) hours as needed.   60 tablet   0   . phenazopyridine (PYRIDIUM) 100 MG tablet   Oral   Take 1 tablet (100 mg total) by mouth every 8 (eight) hours as needed for pain (Burning urination.  Will turn urine and body fluids orange.).   30 tablet   1   . promethazine (PHENERGAN) 25 MG tablet   Oral   Take 25 mg by mouth every 6  (six) hours as needed for nausea.          . rizatriptan (MAXALT) 10 MG tablet   Oral   Take 10 mg by mouth as needed for migraine. May repeat in 2 hours if needed         . senna-docusate (SENOKOT S) 8.6-50 MG per tablet   Oral   Take 1 tablet by mouth 2 (two) times daily.   60 tablet   0   . SUMAtriptan (IMITREX) 100 MG tablet   Oral   Take 100 mg by mouth daily as needed for migraine.          . tamsulosin (FLOMAX) 0.4 MG CAPS capsule   Oral   Take 1 capsule (0.4 mg total) by mouth at bedtime.   30 capsule   3   . topiramate (TOPAMAX) 25 MG tablet   Oral   Take 1 tablet by mouth 2 (two) times daily.         . traMADol (ULTRAM) 50 MG tablet   Oral   Take 50 mg by mouth every 6 (six) hours as needed for moderate pain.          . ciprofloxacin (CIPRO) 500 MG tablet   Oral   Take 1 tablet (500 mg total) by mouth 2 (two) times daily.   14 tablet   0   . HYDROmorphone (DILAUDID) 2 MG tablet   Oral   Take 1 tablet (2 mg total) by mouth every 4 (four) hours as needed for severe pain.   20 tablet   0    BP 120/68  Pulse 115  Temp(Src) 99.5 F (37.5 C) (Oral)  Resp 16  SpO2 100%  LMP 04/18/2013 Physical Exam  Nursing note and vitals reviewed. Constitutional: She is oriented to person, place, and time. She appears well-developed and well-nourished.  HENT:  Head: Normocephalic and atraumatic.  Mouth/Throat: Oropharyngeal exudate present.  Some white plaques on posterior pharynx with erythema.  Eyes: EOM are normal. Pupils are equal, round, and reactive to light.  Neck: Normal range of motion. Neck supple.  Cardiovascular: Normal rate, regular rhythm and normal heart sounds.   No murmur heard. Pulmonary/Chest: Effort normal and breath sounds normal. No respiratory distress. She has no wheezes. She has no rales.  Abdominal: Soft. Bowel sounds are normal. She exhibits no distension. There is tenderness. There is no rebound and no guarding.  Mild lower  abdominal tenderness without rebound or guarding.  Musculoskeletal: Normal range of motion.  Neurological: She is alert and oriented to person, place, and time. No cranial nerve deficit.  Skin: Skin is warm and dry.  Psychiatric: She has  a normal mood and affect. Her speech is normal.    ED Course  Procedures (including critical care time) Labs Review Labs Reviewed  URINALYSIS, ROUTINE W REFLEX MICROSCOPIC - Abnormal; Notable for the following:    APPearance CLOUDY (*)    Hgb urine dipstick LARGE (*)    Protein, ur 100 (*)    Leukocytes, UA MODERATE (*)    All other components within normal limits  CBC WITH DIFFERENTIAL - Abnormal; Notable for the following:    Hemoglobin 11.6 (*)    HCT 34.6 (*)    MCV 77.8 (*)    Platelets 135 (*)    Neutrophils Relative % 78 (*)    All other components within normal limits  POCT I-STAT, CHEM 8 - Abnormal; Notable for the following:    Potassium 3.5 (*)    All other components within normal limits  RAPID STREP SCREEN  CULTURE, GROUP A STREP  URINE MICROSCOPIC-ADD ON   Imaging Review Dg Abd 1 View  05/08/2013   CLINICAL DATA:  Abdominal pain  EXAM: ABDOMEN - 1 VIEW  COMPARISON:  Prior CT from 05/05/2013  FINDINGS: A left ureteral stent is in place and appears appropriately positioned. Tiny calcific densities overlying the left kidney likely represent small calculi versus medullary calcinosis, better evaluated on recent CT. Similar changes overlie the right kidney. Larger calcification overlying the right upper quadrant likely represents previously seen calcified of porta hepatis node. Cholecystectomy clips present within the right upper quadrant. Tubal ligation clips noted.  Visualized bowel gas pattern is nonobstructive. No free air identified. No soft tissue mass.  IMPRESSION: 1. Left ureteral stent in place with small calcific densities overlying both kidneys, likely small bilateral renal calculi and/ medullary calcinosis, better evaluated on  prior CT from 05/05/2013.  2. Nonobstructive bowel gas pattern.   Electronically Signed   By: Rise MuBenjamin  McClintock M.D.   On: 05/08/2013 18:39    EKG Interpretation   None       MDM   1. Hematuria    Patient with hematuria and pain after kidney stone with stent. Continues to have hematuria, that has increased. Kidney function is good. Hemoglobin is mildly decreased and only to get followed. Original stent is in place on x-ray. Discussed with urology. Will restart antibiotics and continue pain medicines. Patient already on oxybutynin. Had sore throat. Negative strep. May be related to procedure a    Juliet RudeNathan R. Rubin PayorPickering, MD 05/08/13 2105

## 2013-05-08 NOTE — ED Notes (Addendum)
Patient reports that she ws seen in the ED on 05/05/13 and was diagnosed with kidney stones and then she had a stent placed. Patient c/o increased pain and bright red blood in her urine. Patient also  reports no BM  In 6 days.

## 2013-05-08 NOTE — Discharge Instructions (Signed)
Follow with Dr. Margarita GrizzleWoodruff as planned

## 2013-05-08 NOTE — ED Provider Notes (Signed)
Medical screening examination/treatment/procedure(s) were performed by non-physician practitioner and as supervising physician I was immediately available for consultation/collaboration.  EKG Interpretation    Date/Time:    Ventricular Rate:    PR Interval:    QRS Duration:   QT Interval:    QTC Calculation:   R Axis:     Text Interpretation:               Juliet RudeNathan R. Rubin PayorPickering, MD 05/08/13 1444

## 2013-05-10 ENCOUNTER — Other Ambulatory Visit: Payer: Self-pay | Admitting: Urology

## 2013-05-10 ENCOUNTER — Encounter (HOSPITAL_BASED_OUTPATIENT_CLINIC_OR_DEPARTMENT_OTHER): Payer: Self-pay | Admitting: *Deleted

## 2013-05-10 LAB — CULTURE, GROUP A STREP

## 2013-05-10 NOTE — Progress Notes (Signed)
NPO AFTER MN. ARRIVE AT 1100. CURRENT LAB RESULTS IN EPIC AND CHART.  WILL TAKE TOPAMAX AM DOS W/ SIPS OF WATER AND IF NEEDED MAY TAKE DILAUDID OR OXYCODONE/ NAUSEA MED.

## 2013-05-15 ENCOUNTER — Encounter (HOSPITAL_BASED_OUTPATIENT_CLINIC_OR_DEPARTMENT_OTHER)
Admission: RE | Disposition: A | Discharge status: Home / Self Care | Payer: Self-pay | Source: Ambulatory Visit | Attending: Urology

## 2013-05-15 ENCOUNTER — Encounter (HOSPITAL_BASED_OUTPATIENT_CLINIC_OR_DEPARTMENT_OTHER): Payer: Medicaid Other | Admitting: Anesthesiology

## 2013-05-15 ENCOUNTER — Ambulatory Visit (HOSPITAL_BASED_OUTPATIENT_CLINIC_OR_DEPARTMENT_OTHER)
Admission: RE | Admit: 2013-05-15 | Discharge: 2013-05-15 | Disposition: A | Discharge status: Home / Self Care | Payer: Medicaid Other | Source: Ambulatory Visit | Attending: Urology | Admitting: Urology

## 2013-05-15 ENCOUNTER — Encounter (HOSPITAL_BASED_OUTPATIENT_CLINIC_OR_DEPARTMENT_OTHER): Payer: Self-pay | Admitting: *Deleted

## 2013-05-15 ENCOUNTER — Ambulatory Visit (HOSPITAL_BASED_OUTPATIENT_CLINIC_OR_DEPARTMENT_OTHER): Payer: Medicaid Other | Admitting: Anesthesiology

## 2013-05-15 DIAGNOSIS — M3500 Sicca syndrome, unspecified: Secondary | ICD-10-CM | POA: Insufficient documentation

## 2013-05-15 DIAGNOSIS — K5900 Constipation, unspecified: Secondary | ICD-10-CM | POA: Insufficient documentation

## 2013-05-15 DIAGNOSIS — N201 Calculus of ureter: Secondary | ICD-10-CM | POA: Insufficient documentation

## 2013-05-15 DIAGNOSIS — Q615 Medullary cystic kidney: Secondary | ICD-10-CM | POA: Insufficient documentation

## 2013-05-15 DIAGNOSIS — F17200 Nicotine dependence, unspecified, uncomplicated: Secondary | ICD-10-CM | POA: Insufficient documentation

## 2013-05-15 DIAGNOSIS — N200 Calculus of kidney: Secondary | ICD-10-CM | POA: Insufficient documentation

## 2013-05-15 DIAGNOSIS — N2889 Other specified disorders of kidney and ureter: Secondary | ICD-10-CM | POA: Insufficient documentation

## 2013-05-15 DIAGNOSIS — G43909 Migraine, unspecified, not intractable, without status migrainosus: Secondary | ICD-10-CM | POA: Insufficient documentation

## 2013-05-15 DIAGNOSIS — Z79899 Other long term (current) drug therapy: Secondary | ICD-10-CM | POA: Insufficient documentation

## 2013-05-15 HISTORY — DX: Nocturia: R35.1

## 2013-05-15 HISTORY — DX: Calculus of ureter: N20.1

## 2013-05-15 HISTORY — DX: Urgency of urination: R39.15

## 2013-05-15 HISTORY — DX: Hematuria, unspecified: R31.9

## 2013-05-15 HISTORY — PX: HOLMIUM LASER APPLICATION: SHX5852

## 2013-05-15 HISTORY — DX: Calculus of kidney: N20.0

## 2013-05-15 HISTORY — DX: Personal history of urinary calculi: Z87.442

## 2013-05-15 HISTORY — PX: CYSTOSCOPY WITH RETROGRADE PYELOGRAM, URETEROSCOPY AND STENT PLACEMENT: SHX5789

## 2013-05-15 SURGERY — CYSTOURETEROSCOPY, WITH RETROGRADE PYELOGRAM AND STENT INSERTION
Anesthesia: General | Site: Ureter | Laterality: Left

## 2013-05-15 MED ORDER — FENTANYL CITRATE 0.05 MG/ML IJ SOLN
INTRAMUSCULAR | Status: DC | PRN
  Administered 2013-05-15 (×2): 50 ug via INTRAVENOUS

## 2013-05-15 MED ORDER — FENTANYL CITRATE 0.05 MG/ML IJ SOLN
25.0000 ug | INTRAMUSCULAR | Status: DC | PRN
  Administered 2013-05-15 (×3): 25 ug via INTRAVENOUS
  Filled 2013-05-15: qty 1

## 2013-05-15 MED ORDER — LIDOCAINE HCL (CARDIAC) 20 MG/ML IV SOLN
INTRAVENOUS | Status: DC | PRN
  Administered 2013-05-15: 100 mg via INTRAVENOUS

## 2013-05-15 MED ORDER — CEFAZOLIN SODIUM-DEXTROSE 2-3 GM-% IV SOLR
2.0000 g | INTRAVENOUS | Status: AC
  Administered 2013-05-15: 2 g via INTRAVENOUS
  Filled 2013-05-15: qty 50

## 2013-05-15 MED ORDER — KETOROLAC TROMETHAMINE 30 MG/ML IJ SOLN
INTRAMUSCULAR | Status: DC | PRN
  Administered 2013-05-15: 30 mg via INTRAVENOUS

## 2013-05-15 MED ORDER — FENTANYL CITRATE 0.05 MG/ML IJ SOLN
INTRAMUSCULAR | Status: AC
  Filled 2013-05-15: qty 2

## 2013-05-15 MED ORDER — DEXAMETHASONE SODIUM PHOSPHATE 10 MG/ML IJ SOLN
INTRAMUSCULAR | Status: DC | PRN
  Administered 2013-05-15: 10 mg via INTRAVENOUS

## 2013-05-15 MED ORDER — SENNOSIDES-DOCUSATE SODIUM 8.6-50 MG PO TABS: 1.0000 | ORAL_TABLET | Freq: Two times a day (BID) | ORAL | Status: DC

## 2013-05-15 MED ORDER — PROPOFOL 10 MG/ML IV BOLUS
INTRAVENOUS | Status: DC | PRN
  Administered 2013-05-15: 200 mg via INTRAVENOUS

## 2013-05-15 MED ORDER — HYDROMORPHONE HCL 2 MG PO TABS
ORAL_TABLET | ORAL | Status: AC
  Filled 2013-05-15: qty 1

## 2013-05-15 MED ORDER — HYDROMORPHONE HCL 2 MG PO TABS: 2.0000 mg | ORAL_TABLET | ORAL | Status: DC | PRN

## 2013-05-15 MED ORDER — PROMETHAZINE HCL 25 MG/ML IJ SOLN
6.2500 mg | INTRAMUSCULAR | Status: DC | PRN
  Filled 2013-05-15: qty 1

## 2013-05-15 MED ORDER — CEPHALEXIN 500 MG PO CAPS: 500.0000 mg | ORAL_CAPSULE | Freq: Three times a day (TID) | ORAL | Status: DC

## 2013-05-15 MED ORDER — SODIUM CHLORIDE 0.9 % IR SOLN
Status: DC | PRN
  Administered 2013-05-15: 2000 mL

## 2013-05-15 MED ORDER — MEPERIDINE HCL 25 MG/ML IJ SOLN
6.2500 mg | INTRAMUSCULAR | Status: DC | PRN
  Filled 2013-05-15: qty 1

## 2013-05-15 MED ORDER — ACETAMINOPHEN 10 MG/ML IV SOLN
INTRAVENOUS | Status: DC | PRN
  Administered 2013-05-15: 1000 mg via INTRAVENOUS

## 2013-05-15 MED ORDER — LACTATED RINGERS IV SOLN
INTRAVENOUS | Status: DC
  Filled 2013-05-15: qty 1000

## 2013-05-15 MED ORDER — MIDAZOLAM HCL 2 MG/2ML IJ SOLN
INTRAMUSCULAR | Status: AC
  Filled 2013-05-15: qty 2

## 2013-05-15 MED ORDER — IOHEXOL 350 MG/ML SOLN
INTRAVENOUS | Status: DC | PRN
  Administered 2013-05-15: 12 mL

## 2013-05-15 MED ORDER — MIDAZOLAM HCL 2 MG/2ML IJ SOLN
INTRAMUSCULAR | Status: DC | PRN
  Administered 2013-05-15: 2 mg via INTRAVENOUS

## 2013-05-15 MED ORDER — ONDANSETRON HCL 4 MG/2ML IJ SOLN
INTRAMUSCULAR | Status: DC | PRN
  Administered 2013-05-15: 4 mg via INTRAVENOUS

## 2013-05-15 MED ORDER — LACTATED RINGERS IV SOLN
INTRAVENOUS | Status: DC
  Administered 2013-05-15 (×2): via INTRAVENOUS
  Filled 2013-05-15: qty 1000

## 2013-05-15 MED ORDER — HYDROMORPHONE HCL 2 MG PO TABS
2.0000 mg | ORAL_TABLET | ORAL | Status: DC | PRN
  Administered 2013-05-15: 2 mg via ORAL
  Filled 2013-05-15: qty 1

## 2013-05-15 MED ORDER — BELLADONNA ALKALOIDS-OPIUM 16.2-60 MG RE SUPP
RECTAL | Status: AC
  Filled 2013-05-15: qty 1

## 2013-05-15 SURGICAL SUPPLY — 42 items
BAG DRAIN URO-CYSTO SKYTR STRL (DRAIN) ×2 IMPLANT
BASKET LASER NITINOL 1.9FR (BASKET) IMPLANT
BASKET STNLS GEMINI 4WIRE 3FR (BASKET) IMPLANT
BASKET ZERO TIP NITINOL 2.4FR (BASKET) ×2 IMPLANT
BRUSH URET BIOPSY 3F (UROLOGICAL SUPPLIES) IMPLANT
CANISTER SUCT LVC 12 LTR MEDI- (MISCELLANEOUS) ×2 IMPLANT
CATH CLEAR GEL 3F BACKSTOP (CATHETERS) IMPLANT
CATH INTERMIT  6FR 70CM (CATHETERS) IMPLANT
CATH ROBINSON RED A/P 14FR (CATHETERS) ×2 IMPLANT
CATH URET 5FR 28IN CONE TIP (BALLOONS)
CATH URET 5FR 28IN OPEN ENDED (CATHETERS) ×2 IMPLANT
CATH URET 5FR 70CM CONE TIP (BALLOONS) IMPLANT
CATH URET DUAL LUMEN 6-10FR 50 (CATHETERS) IMPLANT
CLOTH BEACON ORANGE TIMEOUT ST (SAFETY) ×2 IMPLANT
DRAPE CAMERA CLOSED 9X96 (DRAPES) ×2 IMPLANT
ELECT REM PT RETURN 9FT ADLT (ELECTROSURGICAL)
ELECTRODE REM PT RTRN 9FT ADLT (ELECTROSURGICAL) IMPLANT
FIBER LASER FLEXIVA 200 (UROLOGICAL SUPPLIES) ×2 IMPLANT
FIBER LASER FLEXIVA 365 (UROLOGICAL SUPPLIES) IMPLANT
GLOVE BIO SURGEON STRL SZ7 (GLOVE) ×2 IMPLANT
GLOVE ECLIPSE 7.0 STRL STRAW (GLOVE) ×2 IMPLANT
GLOVE INDICATOR 7.5 STRL GRN (GLOVE) ×2 IMPLANT
GOWN PREVENTION PLUS LG XLONG (DISPOSABLE) IMPLANT
GOWN STRL REUS W/ TWL XL LVL3 (GOWN DISPOSABLE) ×1 IMPLANT
GOWN STRL REUS W/TWL XL LVL3 (GOWN DISPOSABLE) ×3 IMPLANT
GUIDEWIRE 0.038 PTFE COATED (WIRE) IMPLANT
GUIDEWIRE ANG ZIPWIRE 038X150 (WIRE) IMPLANT
GUIDEWIRE STR DUAL SENSOR (WIRE) ×2 IMPLANT
IV NS 1000ML (IV SOLUTION) ×3
IV NS 1000ML BAXH (IV SOLUTION) ×3 IMPLANT
IV NS IRRIG 3000ML ARTHROMATIC (IV SOLUTION) IMPLANT
KIT BALLIN UROMAX 15FX10 (LABEL) IMPLANT
KIT BALLN UROMAX 15FX4 (MISCELLANEOUS) IMPLANT
KIT BALLN UROMAX 26 75X4 (MISCELLANEOUS)
PACK CYSTOSCOPY (CUSTOM PROCEDURE TRAY) ×2 IMPLANT
SET HIGH PRES BAL DIL (LABEL)
SHEATH ACCESS URETERAL 38CM (SHEATH) IMPLANT
SHEATH ACCESS URETERAL 54CM (SHEATH) IMPLANT
SHEATH URET ACCESS 12FR/35CM (UROLOGICAL SUPPLIES) IMPLANT
SHEATH URET ACCESS 12FR/55CM (UROLOGICAL SUPPLIES) IMPLANT
STENT POLARIS 5FRX24 (STENTS) ×2 IMPLANT
SYRINGE IRR TOOMEY STRL 70CC (SYRINGE) ×2 IMPLANT

## 2013-05-15 NOTE — Op Note (Signed)
Urology Operative Report  Date of Procedure: 05/15/13  Surgeon: Natalia Leatherwoodaniel Glenn Christo, MD Assistant:  None  Preoperative Diagnosis: Left ureter stone. Postoperative Diagnosis:  Same  Procedure(s): Left ureteroscopy with laser lithotripsy and stone removal. Left ureter placement. Left ureter stent removal. Cystoscopy.  Estimated blood loss: Minimal  Specimen: Stones sent to [facility] for analysis.  Drains: None  Complications: None  Findings: Obstructing left proximal ureter stone. 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  History of present illness: 33 year old female presented to the ER with an obstructing left ureter stent. She has a history of medullary sponge kidney. She elected to have stent placed and presents today for interval ureteroscopy for stone management.   Procedure in detail: After informed consent was obtained, the patient was taken to the operating room. They were placed in the supine position. SCDs were turned on and in place. IV antibiotics were infused, and general anesthesia was induced. A timeout was performed in which the correct patient, surgical site, and procedure were identified and agreed upon by the team.  The patient was placed in a dorsolithotomy position, making sure to pad all pertinent neurovascular pressure points. The genitals were prepped and draped in the usual sterile fashion.  A rigid cystoscope was advanced through the urethra and into the bladder. The bladder was drained and then attention was turned to the left ureter orifice. The loops to the left ureter stent were grasped and pulled back to the urethral meatus. This was cannulated with a sensor wire which was placed under fluoroscopy up the left ureter, beyond the left proximal ureter stone, and into the left kidney. This wire was then secured as a safety wire.  Flexible ureter scope was then advanced the urethra through the bladder, into the left ureter, and up to the left proximal  ureter. There was an area of narrowing in the distal ureter but otherwise the ureter was patent. There was a large obstructing stone with a large amount of edema present. Lithotripsy was carried out with a holmium laser filament a 200  at a setting of 0.5 J and 20 Hz. Once the stone fragments were broken into smaller pieces they were removed with a 0 tip Nitinol basket.  I then navigated into the left kidney and evaluated the kidney in a systematic fashion to visualize all of the portions of the kidney. There were multiple Randall's plaques, but no free stones. 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 The ureter scope was then withdrawn and there was noted to be a large amount of edema where the stone had previously been obstructing the ureter. There was also an area in the distal ureter which was narrow, but the mucosa was intact.  Because of the edema where the stone had been located I elected to leave a ureter stent in place. I loaded the sensor wire through the cystoscope and placed a 5 x 24 Polaris stent over the wire under fluoroscopy with a good curl deployed in the left renal pelvis and the loops inside the bladder. The tethering string was removed. I then irrigated the bladder free of stone fragments. The stone fragments were sent to the [facility] for chemical analysis.  I then placed 10 cc of lidocaine jelly into the urethra and a belladonna and opium suppository into the rectum. This completed the procedure. She's placed back in a supine position, anesthesia was reversed, and she was taken to the PACU in stable condition.  All counts were correct at the end  of the case.  She'll be given Keflex to take for several days and then she will restart this antibiotic the day before her stent removal this Friday.

## 2013-05-15 NOTE — Discharge Instructions (Signed)
DISCHARGE INSTRUCTIONS FOR KIDNEY STONES OR URETERAL STENT  MEDICATIONS:   1.  Resume all your other meds from home - except do not take any other pain meds that you may have at home.  ACTIVITY 1. No strenuous activity x 1week 2. No driving while on narcotic pain medications 3. Drink plenty of water 4. Continue to walk at home - you can still get blood clots when you are at home, so keep active, but don't over do it. 5. May return to work in 3 days.  BATHING 1. You can shower or a bath.    SIGNS/SYMPTOMS TO CALL: 1. Please call us if you have a fever greater than 101.5, uncontrolled  nausea/vomiting, uncontrolled pain, dizziness, unable to urinate, chest pain, shortness of breath, leg swelling, leg pain, redness around wound, drainage from wound, or any other concerns or questions.  You can reach us at [phone].   Post Anesthesia Home Care Instructions  Activity: Get plenty of rest for the remainder of the day. A responsible adult should stay with you for 24 hours following the procedure.  For the next 24 hours, DO NOT: -Drive a car -Advertising copywriterperate machinery -Drink alcoholic beverages -Take any medication unless instructed by your physician -Make any legal decisions or sign important papers.  Meals: Start with liquid foods such as gelatin or soup. Progress to regular foods as tolerated. Avoid greasy, spicy, heavy foods. If nausea and/or vomiting occur, drink only clear liquids until the nausea and/or vomiting subsides. Call your physician if vomiting continues.  Special Instructions/Symptoms: Your throat may feel dry or sore from the anesthesia or the breathing tube placed in your throat during surgery. If this causes discomfort, gargle with warm salt water. The discomfort should disappear within 24 hours.

## 2013-05-15 NOTE — Transfer of Care (Signed)
Immediate Anesthesia Transfer of Care Note  Patient: Christine Bautista  Procedure(s) Performed: Procedure(s): CYSTOSCOPY WITH RETROGRADE PYELOGRAM, URETEROSCOPY AND STENT EXCHANGE (Left) HOLMIUM LASER APPLICATION (Left)  Patient Location: PACU  Anesthesia Type:General  Level of Consciousness: sedated and responds to stimulation  Airway & Oxygen Therapy: Patient Spontanous Breathing and Patient connected to nasal cannula oxygen  Post-op Assessment: Report given to PACU RN  Post vital signs: Reviewed and stable  Complications: No apparent anesthesia complications

## 2013-05-15 NOTE — H&P (Signed)
Urology History and Physical Exam  CC: Left ureter stones.  HPI:  33 year old female presents today for left ureter stone.  She presented to the ER 05/05/13 with left-sided flank pain and severe nausea.  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  She states she has been told this in the past.  She admitted to the area and had not previously establish care with the urologist.  CT scan revealed constipation.  Kidneys bilaterally.  The stone in the kidney were nonobstructing.  There was left-sided ureter.  There were 2 stones in the ureter in the mid to proximal portion of the ureter.  The largest of these was 7 mm in size.  At that time she elected to have a cystoscopy and left ureter stent placed.  We also discussed definitive management of her stones.  She presents today for cystoscopy, left ureteroscopy, left retrograde pyelogram, laser lithotripsy, and left ureter stent exchange.  We have discussed the risks, benefits, alternatives, and likelihood of achieving goals.  Urine culture from 05/09/13 was negative for growth.  PMH: Past Medical History  Diagnosis Date  . Migraines   . Sjogren's disease   . Left ureteral calculus   . Renal calculus, bilateral   . Nocturia   . Urgency of urination   . Hematuria   . History of kidney stones     PSH: Past Surgical History  Procedure Laterality Date  . Cystoscopy w/ ureteral stent placement Left 05/05/2013    Procedure: CYSTOSCOPY WITH RETROGRADE PYELOGRAM/URETERAL STENT PLACEMENT LEFT;  Surgeon: Milford Cage, MD;  Location: [facility];  Service: Urology;  Laterality: Left;  . Cholecystectomy  2005  . Tubal ligation  2006  . Extracorporeal shock wave lithotripsy  2012    Allergies: No Known Allergies  Medications: No prescriptions prior to admission     Social History: History   Social History  . Marital Status: Single    Spouse Name: N/A    Number of Children: N/A  . Years of Education: N/A    Occupational History  . Not on file.   Social History Main Topics  . Smoking status: Current Every Day Smoker -- 3 years    Types: Cigars  . Smokeless tobacco: Never Used     Comment: 1 TO 2 CIGAR PER DAY  . Alcohol Use: No  . Drug Use: No  . Sexual Activity: Not on file   Other Topics Concern  . Not on file   Social History Narrative  . No narrative on file    Family History: Family History  Problem Relation Age of Onset  . Hepatitis C Mother   . Hypertension Father   . Hyperlipidemia Father   . Asthma Brother     Review of Systems: Positive: Urinary frequency. Negative: Fever, SOB, or chest pain.  A further 10 point review of systems was negative except what is listed in the HPI.  Physical Exam: Filed Vitals:   05/15/13 1057  BP: 110/75  Pulse: 99  Temp: 98.8 F (37.1 C)  Resp: 16    General: No acute distress.  Awake. Head:  Normocephalic.  Atraumatic. ENT:  EOMI.  Mucous membranes moist Neck:  Supple.  No lymphadenopathy. CV:  S1 present. S2 present. Regular rate. Pulmonary: Equal effort bilaterally.  Clear to auscultation bilaterally. Abdomen: Soft.  Non- tender to palpation. Skin:  Normal turgor.  No visible rash. Extremity: No gross deformity of bilateral upper extremities.  No gross deformity of  bilateral lower extremities. Neurologic: Alert. Appropriate mood.    Studies:  No results found for this basename: HGB, WBC, PLT,  in the last 72 hours  No results found for this basename: NA, K, CL, CO2, BUN, CREATININE, CALCIUM, MAGNESIUM, GFRNONAA, GFRAA,  in the last 72 hours   No results found for this basename: PT, INR, APTT,  in the last 72 hours   No components found with this basename: ABG,     Assessment:  Left ureter stones.  Plan: To OR for cystoscopy, left ureteroscopy, left retrograde pyelogram, laser lithotripsy, and left ureter stent exchange.

## 2013-05-15 NOTE — Anesthesia Preprocedure Evaluation (Signed)
Anesthesia Evaluation  Patient identified by MRN, date of birth, ID band Patient awake    Reviewed: Allergy & Precautions, H&P , NPO status , Patient's Chart, lab work & pertinent test results  Airway Mallampati: II TM Distance: >3 FB Neck ROM: Full    Dental no notable dental hx. (+) Teeth Intact and Dental Advisory Given   Pulmonary neg pulmonary ROS, Current Smoker,  breath sounds clear to auscultation  Pulmonary exam normal       Cardiovascular Exercise Tolerance: Good negative cardio ROS  Rhythm:Regular Rate:Normal     Neuro/Psych  Headaches, negative neurological ROS  negative psych ROS   GI/Hepatic negative GI ROS, Neg liver ROS,   Endo/Other  negative endocrine ROS  Renal/GU negative Renal ROS  negative genitourinary   Musculoskeletal   Abdominal   Peds  Hematology negative hematology ROS (+)   Anesthesia Other Findings   Reproductive/Obstetrics negative OB ROS                           Anesthesia Physical  Anesthesia Plan  ASA: II  Anesthesia Plan: General   Post-op Pain Management:    Induction: Intravenous  Airway Management Planned: LMA  Additional Equipment:   Intra-op Plan:   Post-operative Plan:   Informed Consent: I have reviewed the patients History and Physical, chart, labs and discussed the procedure including the risks, benefits and alternatives for the proposed anesthesia with the patient or authorized representative who has indicated his/her understanding and acceptance.   Dental Advisory Given  Plan Discussed with: Surgeon  Anesthesia Plan Comments:         Anesthesia Quick Evaluation

## 2013-05-15 NOTE — Anesthesia Procedure Notes (Signed)
Procedure Name: LMA Insertion Date/Time: 05/15/2013 1:10 PM Performed by: Maris BergerENENNY, Osher Oettinger T Pre-anesthesia Checklist: Patient identified, Emergency Drugs available, Suction available and Patient being monitored Patient Re-evaluated:Patient Re-evaluated prior to inductionOxygen Delivery Method: Circle System Utilized Preoxygenation: Pre-oxygenation with 100% oxygen Intubation Type: IV induction Ventilation: Mask ventilation without difficulty LMA: LMA inserted LMA Size: 4.0 Number of attempts: 1 Airway Equipment and Method: bite block Placement Confirmation: positive ETCO2 Dental Injury: Teeth and Oropharynx as per pre-operative assessment

## 2013-05-15 NOTE — Anesthesia Postprocedure Evaluation (Signed)
  Anesthesia Post-op Note  Patient: Christine Bautista  Procedure(s) Performed: Procedure(s) (LRB): CYSTOSCOPY WITH RETROGRADE PYELOGRAM, URETEROSCOPY AND STENT EXCHANGE (Left) HOLMIUM LASER APPLICATION (Left)  Patient Location: PACU  Anesthesia Type: General  Level of Consciousness: awake and alert   Airway and Oxygen Therapy: Patient Spontanous Breathing  Post-op Pain: mild  Post-op Assessment: Post-op Vital signs reviewed, Patient's Cardiovascular Status Stable, Respiratory Function Stable, Patent Airway and No signs of Nausea or vomiting  Last Vitals:  Filed Vitals:   05/15/13 1545  BP: 124/101  Pulse: 66  Temp:   Resp: 16    Post-op Vital Signs: stable   Complications: No apparent anesthesia complications

## 2013-05-16 ENCOUNTER — Encounter (HOSPITAL_BASED_OUTPATIENT_CLINIC_OR_DEPARTMENT_OTHER): Payer: Self-pay | Admitting: Urology

## 2013-08-16 ENCOUNTER — Emergency Department (HOSPITAL_COMMUNITY): Payer: Medicaid Other

## 2013-08-16 ENCOUNTER — Encounter (HOSPITAL_COMMUNITY): Payer: Self-pay | Admitting: Emergency Medicine

## 2013-08-16 ENCOUNTER — Emergency Department (HOSPITAL_COMMUNITY)
Admission: EM | Admit: 2013-08-16 | Discharge: 2013-08-16 | Disposition: A | Discharge status: Home / Self Care | Payer: Medicaid Other | Attending: Emergency Medicine | Admitting: Emergency Medicine

## 2013-08-16 DIAGNOSIS — M546 Pain in thoracic spine: Secondary | ICD-10-CM | POA: Insufficient documentation

## 2013-08-16 DIAGNOSIS — B9789 Other viral agents as the cause of diseases classified elsewhere: Secondary | ICD-10-CM

## 2013-08-16 DIAGNOSIS — Z79899 Other long term (current) drug therapy: Secondary | ICD-10-CM | POA: Insufficient documentation

## 2013-08-16 DIAGNOSIS — F17200 Nicotine dependence, unspecified, uncomplicated: Secondary | ICD-10-CM | POA: Insufficient documentation

## 2013-08-16 DIAGNOSIS — G43909 Migraine, unspecified, not intractable, without status migrainosus: Secondary | ICD-10-CM | POA: Insufficient documentation

## 2013-08-16 DIAGNOSIS — Z87442 Personal history of urinary calculi: Secondary | ICD-10-CM | POA: Insufficient documentation

## 2013-08-16 DIAGNOSIS — Z862 Personal history of diseases of the blood and blood-forming organs and certain disorders involving the immune mechanism: Secondary | ICD-10-CM | POA: Insufficient documentation

## 2013-08-16 DIAGNOSIS — Z792 Long term (current) use of antibiotics: Secondary | ICD-10-CM | POA: Insufficient documentation

## 2013-08-16 DIAGNOSIS — Z87448 Personal history of other diseases of urinary system: Secondary | ICD-10-CM | POA: Insufficient documentation

## 2013-08-16 DIAGNOSIS — J069 Acute upper respiratory infection, unspecified: Secondary | ICD-10-CM | POA: Insufficient documentation

## 2013-08-16 MED ORDER — HYDROCODONE-ACETAMINOPHEN 5-325 MG PO TABS: 1.0000 | ORAL_TABLET | ORAL | Status: DC | PRN

## 2013-08-16 MED ORDER — IBUPROFEN 800 MG PO TABS: 800.0000 mg | ORAL_TABLET | Freq: Three times a day (TID) | ORAL | Status: DC

## 2013-08-16 NOTE — Discharge Instructions (Signed)
Take ibuprofen w/ food up to three times a day, as needed for pain, fever and chills.  Take vicodin as needed for cough as well as severe back/chest pain. Take sudafed as needed for nasal/sinus congestion.  This may indirectly help with sore throat and cough as well. Get rest, drink plenty of fluids and wash hands often to prevent spread of infection.  You should return to the ER if your cough worsens, you develop difficulty breathing or pain in your chest or symptoms have not started to improve in 5-7days.

## 2013-08-16 NOTE — ED Notes (Signed)
Pt C/O sore throat, cough production, headache, body aches and fever. States she is coughing up "green stuff". Pt states she is also being treated for a kidney stone and has some mid back pain. Pt alert, no acute distress. Skin warm and dry.

## 2013-08-16 NOTE — ED Provider Notes (Signed)
CSN: [id number]     Arrival date & time 08/16/13  2050 History   First MD Initiated Contact with Patient 08/16/13 2100     Chief Complaint  Patient presents with  . Sore Throat     (Consider location/radiation/quality/duration/timing/severity/associated sxs/prior Treatment) HPI History provided by pt.   Pt presents w/ cough productive of green sputum for the past 2-3 days.  Associated w/ sore throat, sinus pressure, diffuse chest pain, aggravated by coughing and deep inspiration, as well as bilateral upper back pain, worse on the right.  Had a temp of 100.2 at work today.  Has not taken anything for sx.  Known sick contact.  H/o pna and had similar back pain at that time.  PMH otherwise sig for sjogren's and "clotting disorder".  Past Medical History  Diagnosis Date  . Migraines   . Sjogren's disease   . Left ureteral calculus   . Renal calculus, bilateral   . Nocturia   . Urgency of urination   . Hematuria   . History of kidney stones    Past Surgical History  Procedure Laterality Date  . Cystoscopy w/ ureteral stent placement Left 05/05/2013    Procedure: CYSTOSCOPY WITH RETROGRADE PYELOGRAM/URETERAL STENT PLACEMENT LEFT;  Surgeon: Milford Cageaniel Young Woodruff, MD;  Location: [facility];  Service: Urology;  Laterality: Left;  . Cholecystectomy  2005  . Tubal ligation  2006  . Extracorporeal shock wave lithotripsy  2012  . Cystoscopy with retrograde pyelogram, ureteroscopy and stent placement Left 05/15/2013    Procedure: CYSTOSCOPY WITH RETROGRADE PYELOGRAM, URETEROSCOPY AND STENT EXCHANGE;  Surgeon: Milford Cageaniel Young Woodruff, MD;  [facility] [place];  Service: Urology;  Laterality: Left;  . Holmium laser application Left 05/15/2013    Procedure: HOLMIUM LASER APPLICATION;  Surgeon: Milford Cageaniel Young Woodruff, MD;  [facility] [place];  Service: Urology;  Laterality: Left;   Family History  Problem Relation Age of Onset  . Hepatitis C Mother   . Hypertension Father    . Hyperlipidemia Father   . Asthma Brother    History  Substance Use Topics  . Smoking status: Current Every Day Smoker -- 3 years    Types: Cigars  . Smokeless tobacco: Never Used     Comment: 1 TO 2 CIGAR PER DAY  . Alcohol Use: No   OB History   Grav Para Term Preterm Abortions TAB SAB Ect Mult Living                 Review of Systems  All other systems reviewed and are negative.     Allergies  Review of patient's allergies indicates no known allergies.  Home Medications   Prior to Admission medications   Medication Sig Start Date End Date Taking? Authorizing Provider  aspirin-acetaminophen-caffeine (EXCEDRIN MIGRAINE) [phone] MG per tablet Take 2 tablets by mouth every 6 (six) hours as needed for headache.    Historical Provider, MD  cephALEXin (KEFLEX) 500 MG capsule Take 1 capsule (500 mg total) by mouth 3 (three) times daily. Take for 3 days then stop. Begin again the day before your stent is removed. 05/15/13   Milford Cageaniel Young Woodruff, MD  HYDROmorphone (DILAUDID) 2 MG tablet Take 1-2 tablets (2-4 mg total) by mouth every 4 (four) hours as needed for severe pain. 05/15/13   Milford Cageaniel Young Woodruff, MD  ibuprofen (ADVIL,MOTRIN) 200 MG tablet Take 600-800 mg by mouth every 6 (six) hours as needed for headache or mild pain.    Historical Provider, MD  mirabegron ER (MYRBETRIQ) 50 MG TB24 tablet Take 50 mg by mouth daily.    Historical Provider, MD  phenazopyridine (PYRIDIUM) 100 MG tablet Take 100 mg by mouth 3 (three) times daily as needed for pain.    Historical Provider, MD  promethazine (PHENERGAN) 25 MG tablet Take 25 mg by mouth every 6 (six) hours as needed for nausea.     Historical Provider, MD  senna-docusate (SENOKOT S) 8.6-50 MG per tablet Take 1 tablet by mouth 2 (two) times daily. 05/15/13   Milford Cageaniel Young Woodruff, MD  SUMAtriptan (IMITREX) 100 MG tablet Take 100 mg by mouth daily as needed for migraine.  05/02/13 05/02/14  Historical Provider, MD  tamsulosin  (FLOMAX) 0.4 MG CAPS capsule Take 1 capsule (0.4 mg total) by mouth at bedtime. 05/05/13   Milford Cageaniel Young Woodruff, MD  topiramate (TOPAMAX) 25 MG tablet Take 1 tablet by mouth every morning.  05/02/13 05/02/14  Historical Provider, MD   BP 116/77  Pulse 71  Temp(Src) 98.4 F (36.9 C) (Oral)  Resp 16  SpO2 100% Physical Exam  Nursing note and vitals reviewed. Constitutional: She is oriented to person, place, and time. She appears well-developed and well-nourished. No distress.  HENT:  Head: Normocephalic and atraumatic.  Posterior pharynx normal.  No tonsillar edema or exudate.  Uvula mid-line.  No trismus.  Tenderness of frontal, maxillary and ethmoid sinuses.   Eyes:  Normal appearance  Neck: Normal range of motion.  Cardiovascular: Normal rate and regular rhythm.   Pulmonary/Chest: Effort normal and breath sounds normal. No respiratory distress. She exhibits no tenderness.  Musculoskeletal: Normal range of motion.  Mild tenderness at and just inferior and medial to right scapula.  Trace, symmetric non-pitting LE edema.  No calf ttp.  Lymphadenopathy:    She has no cervical adenopathy.  Neurological: She is alert and oriented to person, place, and time.  Skin: Skin is warm and dry. No rash noted.  Psychiatric: She has a normal mood and affect. Her behavior is normal.    ED Course  Procedures (including critical care time) Labs Review Labs Reviewed - No data to display  Imaging Review Dg Chest 2 View  08/16/2013   CLINICAL DATA:  Productive cough and chest pain.  EXAM: CHEST  2 VIEW  COMPARISON:  None.  FINDINGS: The heart size and mediastinal contours are within normal limits. Both lungs are clear. The visualized skeletal structures are unremarkable.  IMPRESSION: No active cardiopulmonary disease.   Electronically Signed   By: Loralie ChampagneMark  Gallerani M.D.   On: 08/16/2013 21:54     EKG Interpretation None      MDM   Final diagnoses:  Viral URI with cough    32yo F w/ sjogren's  and "clotting disorder" presents w/ cough x 2 days w/ sinus pressure, sore throat, diffuse chest and bilateral upper back pain, worse on R.  Similar sx when diagnosed w/ pna 3 years ago.  No RF for PE.  On exam, afebrile and VS w/in nml range, no respiratory distress, nml breath sounds, R upper back ttp, trace, symmetric BLE edema w/out calf ttp.  Low clinical suspicion for pna but CXR pending. 9:44 PM   CXR neg.  Results discussed w/ pt.  D/c'd home w/ 8 vicodin (cough suppression and pain) and recommended ibuprofen, sudafed, rest and fluids as well. Return precautions discussed.    Otilio Miuatherine E Jyll Tomaro, PA-C 08/16/13 2246

## 2013-08-18 NOTE — ED Provider Notes (Signed)
Medical screening examination/treatment/procedure(s) were performed by non-physician practitioner and as supervising physician I was immediately available for consultation/collaboration.   Celene KrasJon R Edmar Blankenburg, MD 08/18/13 [phone]

## 2013-10-01 ENCOUNTER — Emergency Department (HOSPITAL_COMMUNITY): Payer: Medicaid Other

## 2013-10-01 ENCOUNTER — Emergency Department (HOSPITAL_COMMUNITY)
Admission: EM | Admit: 2013-10-01 | Discharge: 2013-10-01 | Disposition: A | Discharge status: Home / Self Care | Payer: Medicaid Other | Attending: Emergency Medicine | Admitting: Emergency Medicine

## 2013-10-01 ENCOUNTER — Encounter (HOSPITAL_COMMUNITY): Payer: Self-pay | Admitting: Emergency Medicine

## 2013-10-01 DIAGNOSIS — G43909 Migraine, unspecified, not intractable, without status migrainosus: Secondary | ICD-10-CM | POA: Insufficient documentation

## 2013-10-01 DIAGNOSIS — Y939 Activity, unspecified: Secondary | ICD-10-CM | POA: Insufficient documentation

## 2013-10-01 DIAGNOSIS — Z8739 Personal history of other diseases of the musculoskeletal system and connective tissue: Secondary | ICD-10-CM | POA: Insufficient documentation

## 2013-10-01 DIAGNOSIS — Z87442 Personal history of urinary calculi: Secondary | ICD-10-CM | POA: Insufficient documentation

## 2013-10-01 DIAGNOSIS — Z79899 Other long term (current) drug therapy: Secondary | ICD-10-CM | POA: Insufficient documentation

## 2013-10-01 DIAGNOSIS — S76219A Strain of adductor muscle, fascia and tendon of unspecified thigh, initial encounter: Secondary | ICD-10-CM

## 2013-10-01 DIAGNOSIS — Y929 Unspecified place or not applicable: Secondary | ICD-10-CM | POA: Insufficient documentation

## 2013-10-01 DIAGNOSIS — IMO0002 Reserved for concepts with insufficient information to code with codable children: Secondary | ICD-10-CM | POA: Insufficient documentation

## 2013-10-01 DIAGNOSIS — F17200 Nicotine dependence, unspecified, uncomplicated: Secondary | ICD-10-CM | POA: Insufficient documentation

## 2013-10-01 DIAGNOSIS — X58XXXA Exposure to other specified factors, initial encounter: Secondary | ICD-10-CM | POA: Insufficient documentation

## 2013-10-01 MED ORDER — METHOCARBAMOL 500 MG PO TABS
1000.0000 mg | ORAL_TABLET | Freq: Once | ORAL | Status: AC
  Administered 2013-10-01: 1000 mg via ORAL
  Filled 2013-10-01: qty 2

## 2013-10-01 MED ORDER — METHOCARBAMOL 750 MG PO TABS
750.0000 mg | ORAL_TABLET | Freq: Four times a day (QID) | ORAL | Status: DC
Start: 2013-10-01 — End: 2014-02-09

## 2013-10-01 MED ORDER — OXYCODONE-ACETAMINOPHEN 5-325 MG PO TABS: 2.0000 | ORAL_TABLET | ORAL | Status: DC | PRN

## 2013-10-01 MED ORDER — NAPROXEN 500 MG PO TABS: 500.0000 mg | ORAL_TABLET | Freq: Two times a day (BID) | ORAL | Status: DC

## 2013-10-01 MED ORDER — OXYCODONE-ACETAMINOPHEN 5-325 MG PO TABS
2.0000 | ORAL_TABLET | Freq: Once | ORAL | Status: AC
Start: 2013-10-01 — End: 2013-10-01
  Administered 2013-10-01: 2 via ORAL
  Filled 2013-10-01: qty 2

## 2013-10-01 NOTE — ED Notes (Addendum)
Pt arrives with c/o hip pain that started Wednesday, states she woke up with it and it has gotten progressively worse. At this point, able to ambulate but is uncomfortable/painful, limited ROM. CMS intact. Denies hitting hip, states she is not physically active

## 2013-10-01 NOTE — ED Provider Notes (Signed)
CSN: [id number]     Arrival date & time 10/01/13  0125 History   First MD Initiated Contact with Patient 10/01/13 [phone]     Chief Complaint  Patient presents with  . Hip Pain     (Consider location/radiation/quality/duration/timing/severity/associated sxs/prior Treatment) HPI 33 year old female presents to emergency room with complaint of left hip pain ongoing for the last 4 days.  Patient reports when waking on Wednesday she noted pain.  Pain has been coming increasing since that time.  She denies any new activities or strain to the area.  Patient reports she has some tingling to the lateral aspect of her left hip.  There is no radiation of the pain.  Pain is mainly in the left groin.  No swelling.  No fevers chills weight loss or gain bowel or bladder dysfunction.  No prior history of same.  She's been taking ibuprofen without improvement. Past Medical History  Diagnosis Date  . Migraines   . Sjogren's disease   . Left ureteral calculus   . Renal calculus, bilateral   . Nocturia   . Urgency of urination   . Hematuria   . History of kidney stones    Past Surgical History  Procedure Laterality Date  . Cystoscopy w/ ureteral stent placement Left 05/05/2013    Procedure: CYSTOSCOPY WITH RETROGRADE PYELOGRAM/URETERAL STENT PLACEMENT LEFT;  Surgeon: Milford Cageaniel Young Woodruff, MD;  Location: [facility];  Service: Urology;  Laterality: Left;  . Cholecystectomy  2005  . Tubal ligation  2006  . Extracorporeal shock wave lithotripsy  2012  . Cystoscopy with retrograde pyelogram, ureteroscopy and stent placement Left 05/15/2013    Procedure: CYSTOSCOPY WITH RETROGRADE PYELOGRAM, URETEROSCOPY AND STENT EXCHANGE;  Surgeon: Milford Cageaniel Young Woodruff, MD;  [facility] [place];  Service: Urology;  Laterality: Left;  . Holmium laser application Left 05/15/2013    Procedure: HOLMIUM LASER APPLICATION;  Surgeon: Milford Cageaniel Young Woodruff, MD;  [facility] [place];  Service: Urology;   Laterality: Left;   Family History  Problem Relation Age of Onset  . Hepatitis C Mother   . Hypertension Father   . Hyperlipidemia Father   . Asthma Brother    History  Substance Use Topics  . Smoking status: Current Every Day Smoker -- 3 years    Types: Cigars  . Smokeless tobacco: Never Used     Comment: 1 TO 2 CIGAR PER DAY  . Alcohol Use: No   OB History   Grav Para Term Preterm Abortions TAB SAB Ect Mult Living                 Review of Systems   See History of Present Illness; otherwise all other systems are reviewed and negative  Allergies  Review of patient's allergies indicates no known allergies.  Home Medications   Prior to Admission medications   Medication Sig Start Date End Date Taking? Authorizing Provider  aspirin-acetaminophen-caffeine (EXCEDRIN MIGRAINE) [phone] MG per tablet Take 2 tablets by mouth every 6 (six) hours as needed for headache.   Yes Historical Provider, MD  ibuprofen (ADVIL,MOTRIN) 200 MG tablet Take 600-800 mg by mouth every 6 (six) hours as needed for headache or mild pain.   Yes Historical Provider, MD  SUMAtriptan (IMITREX) 100 MG tablet Take 100 mg by mouth daily as needed for migraine.  05/02/13 05/02/14 Yes Historical Provider, MD  topiramate (TOPAMAX) 25 MG tablet Take 1 tablet by mouth every morning.  05/02/13 05/02/14 Yes Historical Provider, MD   BP 97/58  Pulse 89  Temp(Src) 98.3 F (36.8 C) (Oral)  Resp 16  Ht 5\' 5"  (1.651 m)  Wt 168 lb (76.204 kg)  BMI 27.96 kg/m2  SpO2 99%  LMP 09/17/2013 Physical Exam  Nursing note and vitals reviewed. Constitutional: She is oriented to person, place, and time. She appears well-developed and well-nourished.  HENT:  Head: Normocephalic and atraumatic.  Nose: Nose normal.  Mouth/Throat: Oropharynx is clear and moist.  Eyes: Conjunctivae and EOM are normal. Pupils are equal, round, and reactive to light.  Neck: Normal range of motion. Neck supple. No JVD present. No tracheal  deviation present. No thyromegaly present.  Cardiovascular: Normal rate, regular rhythm, normal heart sounds and intact distal pulses.  Exam reveals no gallop and no friction rub.   No murmur heard. Pulmonary/Chest: Effort normal and breath sounds normal. No stridor. No respiratory distress. She has no wheezes. She has no rales. She exhibits no tenderness.  Abdominal: Soft. Bowel sounds are normal. She exhibits no distension and no mass. There is no tenderness. There is no rebound and no guarding.  Musculoskeletal: Normal range of motion. She exhibits tenderness. She exhibits no edema.  Patient is tender to palpation in the left groin.  There are no masses.  Pulses are normal.  She has pain with external rotation and straight leg raise.  There is no pain to palpation of the exterior hip posteriorly.  There is no pain with internal rotation.  Distally she is neurovascularly intact.  Reflexes are normal sensation is normal  Lymphadenopathy:    She has no cervical adenopathy.  Neurological: She is alert and oriented to person, place, and time. She has normal reflexes. No cranial nerve deficit. She exhibits normal muscle tone. Coordination normal.  Skin: Skin is warm and dry. No rash noted. No erythema. No pallor.  Psychiatric: She has a normal mood and affect. Her behavior is normal. Judgment and thought content normal.    ED Course  Procedures (including critical care time) Labs Review Labs Reviewed - No data to display  Imaging Review Dg Pelvis 1-2 Views  10/01/2013   CLINICAL DATA:  Left hip pain for 3 days.  EXAM: PELVIS - 1-2 VIEW  COMPARISON:  None.  FINDINGS: There is no evidence of fracture or dislocation. Both femoral heads are seated normally within their respective acetabula. No significant degenerative change is appreciated. The sacroiliac joints are unremarkable in appearance.  The visualized bowel gas pattern is grossly unremarkable in appearance. Tubal ligation clips are noted within  the pelvis.  IMPRESSION: No evidence of fracture or dislocation.   Electronically Signed   By: Roanna RaiderJeffery  Chang M.D.   On: 10/01/2013 02:30     EKG Interpretation None      MDM   Final diagnoses:  Groin strain    33 year old female with 4 days of left hip/groin pain.  Suspect groin strain, no signs of hernia, do not suspect referred pain from a stone.  Do not suspect herniated disc.  Plan to treat with pain and muscle spasm medicine, or referred to orthopedics for physical therapy.    Christine Mackielga M Dale Ribeiro, MD 10/01/13 [phone]

## 2013-10-01 NOTE — Discharge Instructions (Signed)
Groin Strain  A groin strain (also called a groin pull) is an injury to the muscles or tendon on the upper inner part of the thigh. These muscles are called the adductor muscles or groin muscles. They are responsible for moving the leg across the body. A muscle strain occurs when a muscle is overstretched and some muscle fibers are torn. A groin strain can range from mild to severe depending on how many muscle fibers are affected and whether the muscle fibers are partially or completely torn.   Groin strains usually occur during exercise or participation in sports. The injury often happens when a sudden, violent force is placed on a muscle, stretching the muscle too far. A strain is more likely to occur when your muscles are not warmed up or if you are not properly conditioned. Depending on the severity of the groin strain, recovery time may vary from a few weeks to several weeks. Severe injuries often require 4 6 weeks for recovery. In these cases, complete healing can take 4 5 months.   CAUSES    Stretching the groin muscles too far or too suddenly, often during side-to-side motion with an abrupt change in direction.   Putting repeated stress on the groin muscles over a long period of time.   Performing vigorous activity without properly stretching the groin muscles beforehand.  SYMPTOMS    Pain and tenderness in the groin area. This begins as sharp pain and persists as a dull ache.   Popping or snapping feeling when the injury occurs (for severe strains).   Swelling or bruising.   Muscle spasms.   Weakness in the leg.   Stiffness in the groin area with decreased ability to move the affected muscles.  DIAGNOSIS   Your caregiver will perform a physical exam to diagnose a groin strain. You will be asked about your symptoms and how the injury occurred. X-rays are sometimes needed to rule out a broken bone or cartilage problems. Your caregiver may order a CT scan or MRI if a complete muscle tear is  suspected.  TREATMENT   A groin strain will often heal on its own. Your caregiver may prescribe medicines to help manage pain and swelling (anti-inflammatory medicine). You may be told to use crutches for the first few days to minimize your pain.  HOME CARE INSTRUCTIONS    Rest. Do not use the strained muscle if it causes pain.   Put ice on the injured area.   Put ice in a plastic bag.   Place a towel between your skin and the bag.   Leave the ice on for 15 20 minutes, every 2 3 hours. Do this for the first 2 days after the injury.   Only take over-the-counter or prescription medicines as directed by your caregiver.   Wrap the injured area with an elastic bandage as directed by your caregiver.   Keep the injured leg raised (elevated).   Walk, stretch, and perform range-of-motion exercises to improve blood flow to the injured area. Only perform these activities if you can do so without any pain.  To prevent muscle strains:   Warm up before exercise.   Develop proper conditioning and strength in the groin muscles.  SEEK IMMEDIATE MEDICAL CARE IF:    You have increased pain or swelling in the affected area.    Your symptoms are not improving or are getting worse.  MAKE SURE YOU:    Understand these instructions.   Will watch your condition.     Will get help right away if you are not doing well or get worse.  Document Released: 12/03/2003 Document Revised: 03/23/2012 Document Reviewed: 12/09/2011  [employer] Patient Information 2014 [employer].

## 2014-02-09 ENCOUNTER — Emergency Department (HOSPITAL_COMMUNITY): Payer: BC Managed Care – PPO

## 2014-02-09 ENCOUNTER — Emergency Department (HOSPITAL_COMMUNITY)
Admission: EM | Admit: 2014-02-09 | Discharge: 2014-02-09 | Disposition: A | Discharge status: Home / Self Care | Payer: BC Managed Care – PPO | Attending: Emergency Medicine | Admitting: Emergency Medicine

## 2014-02-09 ENCOUNTER — Encounter (HOSPITAL_COMMUNITY): Payer: Self-pay | Admitting: Emergency Medicine

## 2014-02-09 DIAGNOSIS — R300 Dysuria: Secondary | ICD-10-CM | POA: Diagnosis present

## 2014-02-09 DIAGNOSIS — R110 Nausea: Secondary | ICD-10-CM | POA: Diagnosis not present

## 2014-02-09 DIAGNOSIS — Z8739 Personal history of other diseases of the musculoskeletal system and connective tissue: Secondary | ICD-10-CM | POA: Diagnosis not present

## 2014-02-09 DIAGNOSIS — R1011 Right upper quadrant pain: Secondary | ICD-10-CM | POA: Insufficient documentation

## 2014-02-09 DIAGNOSIS — Z3202 Encounter for pregnancy test, result negative: Secondary | ICD-10-CM | POA: Diagnosis not present

## 2014-02-09 DIAGNOSIS — G43909 Migraine, unspecified, not intractable, without status migrainosus: Secondary | ICD-10-CM | POA: Diagnosis not present

## 2014-02-09 DIAGNOSIS — N200 Calculus of kidney: Secondary | ICD-10-CM

## 2014-02-09 DIAGNOSIS — R1013 Epigastric pain: Secondary | ICD-10-CM

## 2014-02-09 DIAGNOSIS — N390 Urinary tract infection, site not specified: Secondary | ICD-10-CM

## 2014-02-09 DIAGNOSIS — Z720 Tobacco use: Secondary | ICD-10-CM | POA: Diagnosis not present

## 2014-02-09 DIAGNOSIS — Z7982 Long term (current) use of aspirin: Secondary | ICD-10-CM | POA: Insufficient documentation

## 2014-02-09 DIAGNOSIS — R509 Fever, unspecified: Secondary | ICD-10-CM | POA: Insufficient documentation

## 2014-02-09 LAB — BASIC METABOLIC PANEL
ANION GAP: 10 (ref 5–15)
BUN: 11 mg/dL (ref 6–23)
CALCIUM: 9.1 mg/dL (ref 8.4–10.5)
CHLORIDE: 101 meq/L (ref 96–112)
CO2: 25 meq/L (ref 19–32)
CREATININE: 0.85 mg/dL (ref 0.50–1.10)
GFR calc non Af Amer: 90 mL/min — ABNORMAL LOW (ref >90)
Glucose, Bld: 88 mg/dL (ref 70–99)
Potassium: 3.7 mEq/L (ref 3.7–5.3)
SODIUM: 136 meq/L — AB (ref 137–147)

## 2014-02-09 LAB — URINE MICROSCOPIC-ADD ON

## 2014-02-09 LAB — URINALYSIS, ROUTINE W REFLEX MICROSCOPIC
Bilirubin Urine: NEGATIVE
Glucose, UA: NEGATIVE mg/dL
KETONES UR: NEGATIVE mg/dL
Nitrite: NEGATIVE
PROTEIN: NEGATIVE mg/dL
Specific Gravity, Urine: 1.021 (ref 1.005–1.030)
UROBILINOGEN UA: 1 mg/dL (ref 0.0–1.0)
pH: 7.5 (ref 5.0–8.0)

## 2014-02-09 LAB — CBC
HCT: 36.5 % (ref 36.0–46.0)
Hemoglobin: 11.9 g/dL — ABNORMAL LOW (ref 12.0–15.0)
MCH: 25.3 pg — ABNORMAL LOW (ref 26.0–34.0)
MCHC: 32.6 g/dL (ref 30.0–36.0)
MCV: 77.7 fL — ABNORMAL LOW (ref 78.0–100.0)
Platelets: 155 10*3/uL (ref 150–400)
RBC: 4.7 MIL/uL (ref 3.87–5.11)
RDW: 13.4 % (ref 11.5–15.5)
WBC: 4.4 10*3/uL (ref 4.0–10.5)

## 2014-02-09 LAB — POC URINE PREG, ED: Preg Test, Ur: NEGATIVE

## 2014-02-09 LAB — LIPASE, BLOOD: Lipase: 25 U/L (ref 11–59)

## 2014-02-09 MED ORDER — ONDANSETRON HCL 4 MG/2ML IJ SOLN
4.0000 mg | Freq: Once | INTRAMUSCULAR | Status: AC
  Administered 2014-02-09: 4 mg via INTRAVENOUS
  Filled 2014-02-09: qty 2

## 2014-02-09 MED ORDER — CEPHALEXIN 500 MG PO CAPS: 500.0000 mg | ORAL_CAPSULE | Freq: Four times a day (QID) | ORAL | Status: DC

## 2014-02-09 MED ORDER — DEXTROSE 5 % IV SOLN
1.0000 g | Freq: Once | INTRAVENOUS | Status: AC
  Administered 2014-02-09: 1 g via INTRAVENOUS
  Filled 2014-02-09: qty 10

## 2014-02-09 MED ORDER — HYDROCODONE-ACETAMINOPHEN 5-325 MG PO TABS: 1.0000 | ORAL_TABLET | ORAL | Status: DC | PRN

## 2014-02-09 MED ORDER — SODIUM CHLORIDE 0.9 % IV BOLUS (SEPSIS)
1000.0000 mL | Freq: Once | INTRAVENOUS | Status: AC
  Administered 2014-02-09: 1000 mL via INTRAVENOUS

## 2014-02-09 MED ORDER — ONDANSETRON HCL 8 MG PO TABS: 8.0000 mg | ORAL_TABLET | Freq: Three times a day (TID) | ORAL | Status: DC | PRN

## 2014-02-09 MED ORDER — GI COCKTAIL ~~LOC~~
30.0000 mL | Freq: Once | ORAL | Status: AC
  Administered 2014-02-09: 30 mL via ORAL
  Filled 2014-02-09: qty 30

## 2014-02-09 MED ORDER — HYDROMORPHONE HCL 1 MG/ML IJ SOLN
0.5000 mg | Freq: Once | INTRAMUSCULAR | Status: AC
  Administered 2014-02-09: 0.5 mg via INTRAVENOUS
  Filled 2014-02-09: qty 1

## 2014-02-09 NOTE — ED Provider Notes (Signed)
CSN: [id number]     Arrival date & time 02/09/14  1749 History   First MD Initiated Contact with Patient 02/09/14 1847     Chief Complaint  Patient presents with  . Dysuria     (Consider location/radiation/quality/duration/timing/severity/associated sxs/prior Treatment) HPI Christine Bautista is a 33 y.o. 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 Pt states symptoms began 2 days ago. States worsening. Took some topamax for headache and ibuprofen with no relief of her symptoms. Fever up to 101 today. Hx of infected stones with prior stenting and stone removal. At this time pain is described a dull, radiating from bilateral flank into lower abdomen. Reports pain with urination, urinary frequency and urgency. Admits to nausea, no vomiting. No other complaints.      Past Medical History  Diagnosis Date  . Migraines   . Sjogren's disease   . Left ureteral calculus   . Renal calculus, bilateral   . Nocturia   . Urgency of urination   . Hematuria   . History of kidney stones    Past Surgical History  Procedure Laterality Date  . Cystoscopy w/ ureteral stent placement Left 05/05/2013    Procedure: CYSTOSCOPY WITH RETROGRADE PYELOGRAM/URETERAL STENT PLACEMENT LEFT;  Surgeon: Milford Cageaniel Young Woodruff, MD;  Location: [facility];  Service: Urology;  Laterality: Left;  . Cholecystectomy  2005  . Tubal ligation  2006  . Extracorporeal shock wave lithotripsy  2012  . Cystoscopy with retrograde pyelogram, ureteroscopy and stent placement Left 05/15/2013    Procedure: CYSTOSCOPY WITH RETROGRADE PYELOGRAM, URETEROSCOPY AND STENT EXCHANGE;  Surgeon: Milford Cageaniel Young Woodruff, MD;  [facility];  Service: Urology;  Laterality: Left;  . Holmium laser application Left 05/15/2013    Procedure: HOLMIUM LASER APPLICATION;  Surgeon: Milford Cageaniel Young Woodruff, MD;  [facility];  Service: Urology;  Laterality: Left;   Family History  Problem Relation Age of Onset  . Hepatitis C Mother   . Hypertension Father   . Hyperlipidemia Father   . Asthma Brother    History  Substance Use Topics  . Smoking status: Current Every Day Smoker -- 3 years    Types: Cigars  . Smokeless tobacco: Never Used     Comment: 1 TO 2 CIGAR PER DAY  . Alcohol Use: No   OB History   Grav Para Term Preterm Abortions TAB SAB Ect Mult Living                 Review of Systems  Constitutional: Positive for fever and chills.  Respiratory: Negative for cough, chest tightness and shortness of breath.   Cardiovascular: Negative for chest pain, palpitations and leg swelling.  Gastrointestinal: Positive for nausea and abdominal pain. Negative for vomiting and diarrhea.  Genitourinary: Positive for dysuria, frequency and flank pain. Negative for vaginal bleeding, vaginal discharge, vaginal pain and pelvic pain.  Musculoskeletal: Negative for arthralgias, myalgias, neck pain and neck stiffness.  Skin: Negative for rash.  Neurological: Negative for dizziness, weakness and headaches.  All other systems reviewed and are negative.     Allergies  Review of patient's allergies indicates no known allergies.  Home Medications   Prior to Admission medications   Medication Sig Start Date End Date Taking? Authorizing Provider  aspirin-acetaminophen-caffeine (EXCEDRIN MIGRAINE) [phone] MG per tablet Take 1 tablet by mouth every 6 (six) hours as needed for headache.  Yes Historical Provider, MD  ibuprofen (ADVIL,MOTRIN) 200 MG tablet Take 800 mg by mouth every 8 (eight) hours as needed for headache or mild pain.    Yes Historical Provider, MD  SUMAtriptan (IMITREX) 100 MG tablet Take 100 mg by mouth every 2 (two) hours as needed for migraine.  05/02/13 05/02/14 Yes Historical Provider, MD  topiramate (TOPAMAX) 50 MG tablet Take 50 mg by mouth daily.   Yes Historical Provider, MD    BP 110/64  Pulse 79  Temp(Src) 98.3 F (36.8 C) (Oral)  Resp 18  SpO2 100%  LMP 02/04/2014 Physical Exam  Nursing note and vitals reviewed. Constitutional: She appears well-developed and well-nourished. No distress.  HENT:  Head: Normocephalic.  Eyes: Conjunctivae are normal.  Neck: Neck supple.  Cardiovascular: Normal rate, regular rhythm and normal heart sounds.   Pulmonary/Chest: Effort normal and breath sounds normal. No respiratory distress. She has no wheezes. She has no rales.  Abdominal: Soft. Bowel sounds are normal. She exhibits no distension. There is tenderness. There is no rebound.  RUQ tenderness. No CVA tenderness  Musculoskeletal: She exhibits no edema.  Neurological: She is alert.  Skin: Skin is warm and dry.  Psychiatric: She has a normal mood and affect. Her behavior is normal.    ED Course  Procedures (including critical care time) Labs Review Labs Reviewed  BASIC METABOLIC PANEL - Abnormal; Notable for the following:    Sodium 136 (*)    GFR calc non Af Amer 90 (*)    All other components within normal limits  CBC - Abnormal; Notable for the following:    Hemoglobin 11.9 (*)    MCV 77.7 (*)    MCH 25.3 (*)    All other components within normal limits  URINALYSIS, ROUTINE W REFLEX MICROSCOPIC - Abnormal; Notable for the following:    APPearance CLOUDY (*)    Hgb urine dipstick MODERATE (*)    Leukocytes, UA MODERATE (*)    All other components within normal limits  URINE MICROSCOPIC-ADD ON - Abnormal; Notable for the following:    Bacteria, UA FEW (*)    All other components within normal limits  POC URINE PREG, ED    Imaging Review Ct Abdomen Pelvis Wo Contrast  02/09/2014   CLINICAL DATA:  Right lower quadrant pain, right flank pain since Wednesday morning. Nausea, vomiting. History of stones and prior lithotripsy.  EXAM: CT ABDOMEN AND PELVIS WITHOUT CONTRAST  TECHNIQUE: Multidetector CT imaging of the abdomen and pelvis was performed  following the standard protocol without IV contrast.  COMPARISON:  09/07/2013  FINDINGS: Lung bases are clear.  No effusions.  Heart is normal size.  Again noted are changes of medullary nephrocalcinosis. No ureteral stones or hydronephrosis. Prior cholecystectomy. Liver, spleen, adrenals are unremarkable.  Multiple small calcifications within an around the pancreatic head are again noted and stable, likely postinflammatory. Stomach, large and small bowel are unremarkable. Appendix is visualized and is normal. Bilateral tubal ligation clips noted in the pelvis. Uterus, adnexae and urinary bladder have an unremarkable unenhanced appearance. Aorta is normal caliber.  No acute bony abnormality or focal bone lesion.  IMPRESSION: Changes of bilateral medullary nephrocalcinosis. No ureteral stones or hydronephrosis.  No acute findings in the abdomen or pelvis.  Normal appendix.   Electronically Signed   By: Charlett NoseKevin  Dover M.D.   On: 02/09/2014 20:37     EKG Interpretation None      MDM   Final diagnoses:  Kidney calculi  UTI (lower urinary  tract infection)  Epigastric pain    PT with hx of kidney stones, with prior hospitalizations. Here with similar symptoms. Pt is afebrile, but reports fever earlier, for which she took ibuprofen. Will get labs, UA, CT abd/pelvis.    8:39 PM Labs urnemarkable. BUN/Creat normal. UA showing infection with TNTC WBCs, few bacteria. Rocephin started. CT pending.   9:12 PM Pt's ct negative. Discussed with Dr. Laverle Patter, no further treatment just antibiotics for UTI. Pt informed. Delay in medications. Meds ordered at 19:37. RN informed.   9:57 PM Pt now having epigastric pain. Hx of pancreatitis. Will add on lipase.   Lipase normal. Will d/c home with outpatient follow up.   Filed Vitals:   02/09/14 1756 02/09/14 2125  BP: 110/64 85/42  Pulse: 79 53  Temp: 98.3 F (36.8 C)   TempSrc: Oral   Resp: 18 18  SpO2: 100% 95%     Lottie Mussel, PA-C 02/10/14  0059

## 2014-02-09 NOTE — ED Notes (Signed)
Per pt right flank pain with burning urination-history of kidney stones-had surgery in Jan

## 2014-02-09 NOTE — Discharge Instructions (Signed)
Keflex for infection until all gone. norco for pain. zofran for nausea. Drink plenty of fluids. Please follow up with urology and primary care doctor. Return if worsening.   Urinary Tract Infection Urinary tract infections (UTIs) can develop anywhere along your urinary tract. Your urinary tract is your body's drainage system for removing wastes and extra water. Your urinary tract includes two kidneys, two ureters, a bladder, and a urethra. Your kidneys are a pair of bean-shaped organs. Each kidney is about the size of your fist. They are located below your ribs, one on each side of your spine. CAUSES Infections are caused by microbes, which are microscopic organisms, including fungi, viruses, and bacteria. These organisms are so small that they can only be seen through a microscope. Bacteria are the microbes that most commonly cause UTIs. SYMPTOMS  Symptoms of UTIs may vary by age and gender of the patient and by the location of the infection. Symptoms in young women typically include a frequent and intense urge to urinate and a painful, burning feeling in the bladder or urethra during urination. Older women and men are more likely to be tired, shaky, and weak and have muscle aches and abdominal pain. A fever may mean the infection is in your kidneys. Other symptoms of a kidney infection include pain in your back or sides below the ribs, nausea, and vomiting. DIAGNOSIS To diagnose a UTI, your caregiver will ask you about your symptoms. Your caregiver also will ask to provide a urine sample. The urine sample will be tested for bacteria and white blood cells. White blood cells are made by your body to help fight infection. TREATMENT  Typically, UTIs can be treated with medication. Because most UTIs are caused by a bacterial infection, they usually can be treated with the use of antibiotics. The choice of antibiotic and length of treatment depend on your symptoms and the type of bacteria causing your  infection. HOME CARE INSTRUCTIONS  If you were prescribed antibiotics, take them exactly as your caregiver instructs you. Finish the medication even if you feel better after you have only taken some of the medication.  Drink enough water and fluids to keep your urine clear or pale yellow.  Avoid caffeine, tea, and carbonated beverages. They tend to irritate your bladder.  Empty your bladder often. Avoid holding urine for long periods of time.  Empty your bladder before and after sexual intercourse.  After a bowel movement, women should cleanse from front to back. Use each tissue only once. SEEK MEDICAL CARE IF:   You have back pain.  You develop a fever.  Your symptoms do not begin to resolve within 3 days. SEEK IMMEDIATE MEDICAL CARE IF:   You have severe back pain or lower abdominal pain.  You develop chills.  You have nausea or vomiting.  You have continued burning or discomfort with urination. MAKE SURE YOU:   Understand these instructions.  Will watch your condition.  Will get help right away if you are not doing well or get worse. Document Released: 01/14/2005 Document Revised: 10/06/2011 Document Reviewed: 05/15/2011 [facility] Patient Information 2015 [place] [place]. This information is not intended to replace advice given to you by your health care provider. Make sure you discuss any questions you have with your health care provider.

## 2014-02-11 NOTE — ED Provider Notes (Signed)
Medical screening examination/treatment/procedure(s) were performed by non-physician practitioner and as supervising physician I was immediately available for consultation/collaboration.   EKG Interpretation None        Matalyn Nawaz T Andrell Tallman, MD 02/11/14 2334 

## 2014-03-05 ENCOUNTER — Encounter (HOSPITAL_COMMUNITY): Payer: Self-pay | Admitting: Emergency Medicine

## 2014-03-05 ENCOUNTER — Emergency Department (HOSPITAL_COMMUNITY): Payer: BC Managed Care – PPO

## 2014-03-05 ENCOUNTER — Emergency Department (HOSPITAL_COMMUNITY)
Admission: EM | Admit: 2014-03-05 | Discharge: 2014-03-05 | Disposition: A | Discharge status: Home / Self Care | Payer: BC Managed Care – PPO | Attending: Emergency Medicine | Admitting: Emergency Medicine

## 2014-03-05 DIAGNOSIS — S4992XA Unspecified injury of left shoulder and upper arm, initial encounter: Secondary | ICD-10-CM | POA: Diagnosis present

## 2014-03-05 DIAGNOSIS — Y998 Other external cause status: Secondary | ICD-10-CM | POA: Insufficient documentation

## 2014-03-05 DIAGNOSIS — Z87442 Personal history of urinary calculi: Secondary | ICD-10-CM | POA: Insufficient documentation

## 2014-03-05 DIAGNOSIS — Z792 Long term (current) use of antibiotics: Secondary | ICD-10-CM | POA: Insufficient documentation

## 2014-03-05 DIAGNOSIS — Y939 Activity, unspecified: Secondary | ICD-10-CM | POA: Insufficient documentation

## 2014-03-05 DIAGNOSIS — W25XXXA Contact with sharp glass, initial encounter: Secondary | ICD-10-CM | POA: Diagnosis not present

## 2014-03-05 DIAGNOSIS — S41112A Laceration without foreign body of left upper arm, initial encounter: Secondary | ICD-10-CM | POA: Diagnosis not present

## 2014-03-05 DIAGNOSIS — S61512A Laceration without foreign body of left wrist, initial encounter: Secondary | ICD-10-CM | POA: Diagnosis not present

## 2014-03-05 DIAGNOSIS — Z720 Tobacco use: Secondary | ICD-10-CM | POA: Diagnosis not present

## 2014-03-05 DIAGNOSIS — IMO0002 Reserved for concepts with insufficient information to code with codable children: Secondary | ICD-10-CM

## 2014-03-05 DIAGNOSIS — Z79899 Other long term (current) drug therapy: Secondary | ICD-10-CM | POA: Insufficient documentation

## 2014-03-05 DIAGNOSIS — Y929 Unspecified place or not applicable: Secondary | ICD-10-CM | POA: Insufficient documentation

## 2014-03-05 MED ORDER — LIDOCAINE-EPINEPHRINE (PF) 2 %-1:200000 IJ SOLN
10.0000 mL | Freq: Once | INTRAMUSCULAR | Status: AC
  Administered 2014-03-05: 10 mL via INTRADERMAL
  Filled 2014-03-05: qty 20

## 2014-03-05 MED ORDER — LIDOCAINE-EPINEPHRINE-TETRACAINE (LET) SOLUTION
3.0000 mL | Freq: Once | NASAL | Status: AC
  Administered 2014-03-05: 3 mL via TOPICAL
  Filled 2014-03-05: qty 3

## 2014-03-05 MED ORDER — LIDOCAINE HCL (CARDIAC) 20 MG/ML IV SOLN
INTRAVENOUS | Status: AC
  Filled 2014-03-05: qty 5

## 2014-03-05 MED ORDER — IBUPROFEN 200 MG PO TABS
400.0000 mg | ORAL_TABLET | Freq: Once | ORAL | Status: AC
  Administered 2014-03-05: 400 mg via ORAL
  Filled 2014-03-05: qty 2

## 2014-03-05 NOTE — Discharge Instructions (Signed)
You may take acetaminophen and ibuprofen as needed for pain. See below for further instructions.  °

## 2014-03-05 NOTE — ED Notes (Signed)
Pt to ED via [facility] with multiple lacerations to left arm after accidentally putting arm through glass door.  Laceration wrapped by EMS.  Bleeding controlled at present.

## 2014-03-05 NOTE — ED Notes (Signed)
Pt in xray

## 2014-03-05 NOTE — ED Notes (Addendum)
When pt was asked about police evolvement pt reports that the police have already been contacted and were on scene.  Registration made aware that pt needs to be a XXX pt.  Pt is tearful upon assessment but denies any other injuries at this time.

## 2014-03-05 NOTE — ED Notes (Signed)
PA at bedside suturing wound to left arm.
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
## 2014-03-05 NOTE — ED Notes (Signed)
Pt has 5mm laceration to left FA and 3mm laceration to left upper arm.

## 2014-07-26 ENCOUNTER — Other Ambulatory Visit: Payer: Self-pay | Admitting: Allergy and Immunology

## 2014-07-26 ENCOUNTER — Ambulatory Visit
Admission: RE | Admit: 2014-07-26 | Discharge: 2014-07-26 | Disposition: A | Discharge status: Home / Self Care | Payer: BLUE CROSS/BLUE SHIELD | Source: Ambulatory Visit | Attending: Allergy and Immunology | Admitting: Allergy and Immunology

## 2014-07-26 DIAGNOSIS — R05 Cough: Secondary | ICD-10-CM

## 2014-07-26 DIAGNOSIS — R059 Cough, unspecified: Secondary | ICD-10-CM

## 2014-12-30 ENCOUNTER — Emergency Department (HOSPITAL_COMMUNITY)
Admission: EM | Admit: 2014-12-30 | Discharge: 2014-12-30 | Disposition: A | Discharge status: Home / Self Care | Payer: BLUE CROSS/BLUE SHIELD | Attending: Emergency Medicine | Admitting: Emergency Medicine

## 2014-12-30 ENCOUNTER — Encounter (HOSPITAL_COMMUNITY): Payer: Self-pay | Admitting: Emergency Medicine

## 2014-12-30 ENCOUNTER — Emergency Department (HOSPITAL_COMMUNITY): Payer: BLUE CROSS/BLUE SHIELD

## 2014-12-30 DIAGNOSIS — Z87442 Personal history of urinary calculi: Secondary | ICD-10-CM | POA: Insufficient documentation

## 2014-12-30 DIAGNOSIS — Z79899 Other long term (current) drug therapy: Secondary | ICD-10-CM | POA: Insufficient documentation

## 2014-12-30 DIAGNOSIS — R109 Unspecified abdominal pain: Secondary | ICD-10-CM | POA: Diagnosis present

## 2014-12-30 DIAGNOSIS — G43909 Migraine, unspecified, not intractable, without status migrainosus: Secondary | ICD-10-CM | POA: Insufficient documentation

## 2014-12-30 DIAGNOSIS — D696 Thrombocytopenia, unspecified: Secondary | ICD-10-CM | POA: Diagnosis not present

## 2014-12-30 DIAGNOSIS — N29 Other disorders of kidney and ureter in diseases classified elsewhere: Secondary | ICD-10-CM

## 2014-12-30 DIAGNOSIS — Z720 Tobacco use: Secondary | ICD-10-CM | POA: Insufficient documentation

## 2014-12-30 DIAGNOSIS — Z8739 Personal history of other diseases of the musculoskeletal system and connective tissue: Secondary | ICD-10-CM | POA: Insufficient documentation

## 2014-12-30 LAB — URINE MICROSCOPIC-ADD ON

## 2014-12-30 LAB — CBC WITH DIFFERENTIAL/PLATELET
Basophils Absolute: 0 10*3/uL (ref 0.0–0.1)
Basophils Relative: 0 % (ref 0–1)
EOS ABS: 0 10*3/uL (ref 0.0–0.7)
EOS PCT: 1 % (ref 0–5)
HCT: 38 % (ref 36.0–46.0)
Hemoglobin: 12.4 g/dL (ref 12.0–15.0)
LYMPHS ABS: 1.6 10*3/uL (ref 0.7–4.0)
Lymphocytes Relative: 28 % (ref 12–46)
MCH: 26 pg (ref 26.0–34.0)
MCHC: 32.6 g/dL (ref 30.0–36.0)
MCV: 79.7 fL (ref 78.0–100.0)
MONOS PCT: 7 % (ref 3–12)
Monocytes Absolute: 0.4 10*3/uL (ref 0.1–1.0)
NEUTROS PCT: 64 % (ref 43–77)
Neutro Abs: 3.6 10*3/uL (ref 1.7–7.7)
PLATELETS: 138 10*3/uL — AB (ref 150–400)
RBC: 4.77 MIL/uL (ref 3.87–5.11)
RDW: 13.6 % (ref 11.5–15.5)
WBC: 5.6 10*3/uL (ref 4.0–10.5)

## 2014-12-30 LAB — URINALYSIS, ROUTINE W REFLEX MICROSCOPIC
Bilirubin Urine: NEGATIVE
Glucose, UA: NEGATIVE mg/dL
Hgb urine dipstick: NEGATIVE
KETONES UR: NEGATIVE mg/dL
Nitrite: POSITIVE — AB
PH: 6 (ref 5.0–8.0)
Protein, ur: NEGATIVE mg/dL
SPECIFIC GRAVITY, URINE: 1.014 (ref 1.005–1.030)
UROBILINOGEN UA: 2 mg/dL — AB (ref 0.0–1.0)

## 2014-12-30 LAB — BASIC METABOLIC PANEL
Anion gap: 7 (ref 5–15)
BUN: 9 mg/dL (ref 6–20)
CO2: 26 mmol/L (ref 22–32)
CREATININE: 0.81 mg/dL (ref 0.44–1.00)
Calcium: 9.2 mg/dL (ref 8.9–10.3)
Chloride: 106 mmol/L (ref 101–111)
GFR calc Af Amer: >60 mL/min (ref >60)
GFR calc non Af Amer: >60 mL/min (ref >60)
Glucose, Bld: 67 mg/dL (ref 65–99)
Potassium: 3.5 mmol/L (ref 3.5–5.1)
SODIUM: 139 mmol/L (ref 135–145)

## 2014-12-30 LAB — I-STAT BETA HCG BLOOD, ED (MC, WL, AP ONLY)

## 2014-12-30 MED ORDER — SODIUM CHLORIDE 0.9 % IV SOLN
1000.0000 mL | INTRAVENOUS | Status: DC
  Administered 2014-12-30: 1000 mL via INTRAVENOUS

## 2014-12-30 MED ORDER — ONDANSETRON HCL 4 MG/2ML IJ SOLN
4.0000 mg | Freq: Once | INTRAMUSCULAR | Status: AC
  Administered 2014-12-30: 4 mg via INTRAVENOUS
  Filled 2014-12-30: qty 2

## 2014-12-30 MED ORDER — ONDANSETRON HCL 4 MG/2ML IJ SOLN
4.0000 mg | Freq: Once | INTRAMUSCULAR | Status: AC
Start: 2014-12-30 — End: 2014-12-30
  Administered 2014-12-30: 4 mg via INTRAVENOUS
  Filled 2014-12-30: qty 2

## 2014-12-30 MED ORDER — OXYCODONE-ACETAMINOPHEN 5-325 MG PO TABS: 1.0000 | ORAL_TABLET | ORAL | Status: DC | PRN

## 2014-12-30 MED ORDER — SODIUM CHLORIDE 0.9 % IV SOLN
1000.0000 mL | Freq: Once | INTRAVENOUS | Status: AC
  Administered 2014-12-30: 1000 mL via INTRAVENOUS

## 2014-12-30 MED ORDER — HYDROMORPHONE HCL 1 MG/ML IJ SOLN
1.0000 mg | Freq: Once | INTRAMUSCULAR | Status: AC
  Administered 2014-12-30: 1 mg via INTRAVENOUS
  Filled 2014-12-30: qty 1

## 2014-12-30 MED ORDER — MORPHINE SULFATE (PF) 4 MG/ML IV SOLN
4.0000 mg | Freq: Once | INTRAVENOUS | Status: AC
  Administered 2014-12-30: 4 mg via INTRAVENOUS
  Filled 2014-12-30: qty 1

## 2014-12-30 NOTE — ED Notes (Signed)
Pt from home c/o urinary frequency and pain over bladder when she urinates. She reports taking azo without relief.

## 2014-12-30 NOTE — ED Provider Notes (Signed)
CSN: [id number]     Arrival date & time 12/30/14  0033 History  This chart was scribed for Dione Booze, MD by Octavia Heir, ED Scribe. This patient was seen in room [facility] and the patient's care was started at 3:47 AM.    Chief Complaint  Patient presents with  . Urinary Frequency  . Flank Pain      The history is provided by the patient. No language interpreter was used.   HPI Comments: Christine Bautista is a 34 y.o. female who presents to the Emergency Department complaining of constant, gradual worsening right flank pain onset 3 days ago. Pt has associated nausea. She rates her current pain a 7/10 and reports her abdomen is painful whenever she urinates. Pt has been taking azo to alleviate the pain with no relief. Pt denies vomiting, dysuria, and urinary frequency.  Past Medical History  Diagnosis Date  . Migraines   . Sjogren's disease   . Left ureteral calculus   . Renal calculus, bilateral   . Nocturia   . Urgency of urination   . Hematuria   . History of kidney stones    Past Surgical History  Procedure Laterality Date  . Cystoscopy w/ ureteral stent placement Left 05/05/2013    Procedure: CYSTOSCOPY WITH RETROGRADE PYELOGRAM/URETERAL STENT PLACEMENT LEFT;  Surgeon: Milford Cage, MD;  Location: [facility];  Service: Urology;  Laterality: Left;  . Cholecystectomy  2005  . Tubal ligation  2006  . Extracorporeal shock wave lithotripsy  2012  . Cystoscopy with retrograde pyelogram, ureteroscopy and stent placement Left 05/15/2013    Procedure: CYSTOSCOPY WITH RETROGRADE PYELOGRAM, URETEROSCOPY AND STENT EXCHANGE;  Surgeon: Milford Cage, MD;  Location: [facility];  Service: Urology;  Laterality: Left;  . Holmium laser application Left 05/15/2013    Procedure: HOLMIUM LASER APPLICATION;  Surgeon: Milford Cage, MD;  Location: [facility];  Service: Urology;  Laterality: Left;   Family History  Problem Relation Age of Onset   . Hepatitis C Mother   . Hypertension Father   . Hyperlipidemia Father   . Asthma Brother    Social History  Substance Use Topics  . Smoking status: Current Every Day Smoker -- 3 years    Types: Cigars  . Smokeless tobacco: Never Used     Comment: 1 TO 2 CIGAR PER DAY  . Alcohol Use: No   OB History    No data available     Review of Systems  Gastrointestinal: Positive for nausea. Negative for vomiting.  Genitourinary: Positive for flank pain. Negative for dysuria and frequency.  All other systems reviewed and are negative.     Allergies  Review of patient's allergies indicates no known allergies.  Home Medications   Prior to Admission medications   Medication Sig Start Date End Date Taking? Authorizing Provider  aspirin-acetaminophen-caffeine (EXCEDRIN MIGRAINE) [id number] MG per tablet Take 1 tablet by mouth every 6 (six) hours as needed for headache.     Historical Provider, MD  cephALEXin (KEFLEX) 500 MG capsule Take 1 capsule (500 mg total) by mouth 4 (four) times daily. Patient not taking: Reported on 03/05/2014 02/09/14   Jaynie Crumble, PA-C  HYDROcodone-acetaminophen (NORCO/VICODIN) 5-325 MG per tablet Take 1 tablet by mouth every 4 (four) hours as needed for moderate pain or severe pain. Patient not taking: Reported on 03/05/2014 02/09/14   Tatyana Kirichenko, PA-C  ibuprofen (ADVIL,MOTRIN) 200 MG tablet Take 800 mg by mouth every 8 (eight) hours as needed  for headache or mild pain.     Historical Provider, MD  ondansetron (ZOFRAN) 8 MG tablet Take 1 tablet (8 mg total) by mouth every 8 (eight) hours as needed for nausea or vomiting. Patient not taking: Reported on 03/05/2014 02/09/14   Jaynie Crumble, PA-C  SUMAtriptan (IMITREX) 100 MG tablet Take 100 mg by mouth every 2 (two) hours as needed for migraine.  05/02/13 05/02/14  Historical Provider, MD  topiramate (TOPAMAX) 50 MG tablet Take 50 mg by mouth daily.    Historical Provider, MD   Triage vitals:  BP 133/65 mmHg  Pulse 67  Temp(Src) 98.9 F (37.2 C) (Oral)  Resp 18  Wt 170 lb (77.111 kg)  SpO2 99%  LMP 12/18/2014 Physical Exam  Constitutional: She is oriented to person, place, and time. She appears well-developed and well-nourished.  HENT:  Head: Normocephalic.  Eyes: EOM are normal. Pupils are equal, round, and reactive to light.  Neck: Normal range of motion. Neck supple. No JVD present.  Cardiovascular: Normal rate, regular rhythm and normal heart sounds.   No murmur heard. Pulmonary/Chest: Effort normal and breath sounds normal. She has no wheezes. She has no rales. She exhibits no tenderness.  Abdominal: Soft. She exhibits no distension and no mass.  Mild right CVA tenderness, bowel sounds decreased  Musculoskeletal: Normal range of motion. She exhibits no edema.  Lymphadenopathy:    She has no cervical adenopathy.  Neurological: She is alert and oriented to person, place, and time. No cranial nerve deficit. She exhibits normal muscle tone. Coordination normal.  Skin: Skin is warm and dry. No rash noted.  Psychiatric: She has a normal mood and affect. Her behavior is normal.  Nursing note and vitals reviewed.   ED Course  Procedures  DIAGNOSTIC STUDIES: Oxygen Saturation is 99% on RA, normal by my interpretation.  COORDINATION OF CARE:  3:51 AM Discussed treatment plan with pt at bedside and pt agreed to plan.  Labs Review Results for orders placed or performed during the hospital encounter of 12/30/14  Urinalysis, Routine w reflex microscopic-may I&O cath if menses (not at [facility])  Result Value Ref Range   Color, Urine ORANGE (A) YELLOW   APPearance CLOUDY (A) CLEAR   Specific Gravity, Urine 1.014 1.005 - 1.030   pH 6.0 5.0 - 8.0   Glucose, UA NEGATIVE NEGATIVE mg/dL   Hgb urine dipstick NEGATIVE NEGATIVE   Bilirubin Urine NEGATIVE NEGATIVE   Ketones, ur NEGATIVE NEGATIVE mg/dL   Protein, ur NEGATIVE NEGATIVE mg/dL   Urobilinogen, UA 2.0 (H) 0.0 - 1.0 mg/dL    Nitrite POSITIVE (A) NEGATIVE   Leukocytes, UA SMALL (A) NEGATIVE  Urine microscopic-add on  Result Value Ref Range   Squamous Epithelial / LPF MANY (A) RARE   WBC, UA 0-2 <3 WBC/hpf   RBC / HPF 0-2 <3 RBC/hpf   Bacteria, UA FEW (A) RARE  Basic metabolic panel  Result Value Ref Range   Sodium 139 135 - 145 mmol/L   Potassium 3.5 3.5 - 5.1 mmol/L   Chloride 106 101 - 111 mmol/L   CO2 26 22 - 32 mmol/L   Glucose, Bld 67 65 - 99 mg/dL   BUN 9 6 - 20 mg/dL   Creatinine, Ser 1.61 0.44 - 1.00 mg/dL   Calcium 9.2 8.9 - 09.6 mg/dL   GFR calc non Af Amer >60 >60 mL/min   GFR calc Af Amer >60 >60 mL/min   Anion gap 7 5 - 15  CBC with Differential  Result Value Ref Range   WBC 5.6 4.0 - 10.5 K/uL   RBC 4.77 3.87 - 5.11 MIL/uL   Hemoglobin 12.4 12.0 - 15.0 g/dL   HCT 16.1 09.6 - 04.5 %   MCV 79.7 78.0 - 100.0 fL   MCH 26.0 26.0 - 34.0 pg   MCHC 32.6 30.0 - 36.0 g/dL   RDW 40.9 81.1 - 91.4 %   Platelets 138 (L) 150 - 400 K/uL   Neutrophils Relative % 64 43 - 77 %   Neutro Abs 3.6 1.7 - 7.7 K/uL   Lymphocytes Relative 28 12 - 46 %   Lymphs Abs 1.6 0.7 - 4.0 K/uL   Monocytes Relative 7 3 - 12 %   Monocytes Absolute 0.4 0.1 - 1.0 K/uL   Eosinophils Relative 1 0 - 5 %   Eosinophils Absolute 0.0 0.0 - 0.7 K/uL   Basophils Relative 0 0 - 1 %   Basophils Absolute 0.0 0.0 - 0.1 K/uL  I-Stat Beta hCG blood, ED ([facility], [facility], [facility] only)  Result Value Ref Range   I-stat hCG, quantitative <5.0 <5 mIU/mL   Comment 3            Imaging Review Ct Renal Stone Study  12/30/2014   CLINICAL DATA:  Constant gradual worsening right flank pain onset 3 days ago. Nausea.  EXAM: CT ABDOMEN AND PELVIS WITHOUT CONTRAST  TECHNIQUE: Multidetector CT imaging of the abdomen and pelvis was performed following the standard protocol without IV contrast.  COMPARISON:  02/09/2014  FINDINGS: Mild dependent changes in the lung bases. Small pneumatocele in the anterior right lung base.  Diffuse bilateral medullary  calcinosis within both kidneys. No hydronephrosis or hydroureter. No ureteral stones or bladder stones demonstrated. No bladder wall thickening.  Surgical absence of the gallbladder. Calcifications in the head of the pancreas probably suggesting chronic pancreatitis. No evidence of acute infiltration at the pancreas. No bile duct dilatation. The unenhanced appearance of the liver, spleen, adrenal glands, abdominal aorta, inferior vena cava, and retroperitoneal lymph nodes is unremarkable. Stomach, small bowel, and colon are not abnormally distended. Stool fills the colon. No free air or free fluid in the abdomen.  Pelvis: Appendix is normal. Uterus and ovaries are not enlarged. No free or loculated pelvic fluid collections. No pelvic mass or lymphadenopathy. No destructive bone lesions.  IMPRESSION: Bilateral medullary nephrocalcinosis. No hydronephrosis or hydroureter. No ureteral stones. Calcifications in the head of the pancreas suggest chronic pancreatitis. No evidence of acute pancreatitis.   Electronically Signed   By: Burman Nieves M.D.   On: 12/30/2014 05:48   I have personally reviewed and evaluated these images and lab results as part of my medical decision-making.   MDM   Final diagnoses:  Right flank pain  Nephrocalcinosis  Thrombocytopenia    Right flank pain. Old records are reviewed and she had a CT of abdomen and pelvis in October 2015 showing nephrocalcinosis. Current pain is consistent with renal colic but also consistent with UTI. Urinalysis has color interference which is causing a false positive nitrite. There is no evidence of actual urinary infection with 0-2 WBCs and few bacteria in spite of many epithelial cells. CBC and metabolic panel are normal. She is given morphine for pain and ondansetron for nausea and sent for renal stone protocol CT scan which shows evidence of nephrocalcinosis but no evidence of ureterolithiasis or hydronephrosis. Patient did not get adequate relief  from morphine and she is given a dose of hydromorphone with good relief  of pain. Have a sin the findings to her. Cause for her pain is not clear. She is referred to urology for follow-up and is given a prescription for oxycodone-acetaminophen for pain. Of note, mild thrombocytopenia is present of uncertain significance.  I personally performed the services described in this documentation, which was scribed in my presence. The recorded information has been reviewed and is accurate.     Dione Booze, MD 12/30/14 [phone]

## 2014-12-30 NOTE — Discharge Instructions (Signed)
Your evaluation in the ED did not show a reason for ear pain. Please make an appointment with the urologist for further evaluation.  Flank Pain Flank pain refers to pain that is located on the side of the body between the upper abdomen and the back. The pain may occur over a short period of time (acute) or may be long-term or reoccurring (chronic). It may be mild or severe. Flank pain can be caused by many things. CAUSES  Some of the more common causes of flank pain include:  Muscle strains.   Muscle spasms.   A disease of your spine (vertebral disk disease).   A lung infection (pneumonia).   Fluid around your lungs (pulmonary edema).   A kidney infection.   Kidney stones.   A very painful skin rash caused by the chickenpox virus (shingles).   Gallbladder disease.  HOME CARE INSTRUCTIONS  Home care will depend on the cause of your pain. In general,  Rest as directed by your caregiver.  Drink enough fluids to keep your urine clear or pale yellow.  Only take over-the-counter or prescription medicines as directed by your caregiver. Some medicines may help relieve the pain.  Tell your caregiver about any changes in your pain.  Follow up with your caregiver as directed. SEEK IMMEDIATE MEDICAL CARE IF:   Your pain is not controlled with medicine.   You have new or worsening symptoms.  Your pain increases.   You have abdominal pain.   You have shortness of breath.   You have persistent nausea or vomiting.   You have swelling in your abdomen.   You feel faint or pass out.   You have blood in your urine.  You have a fever or persistent symptoms for more than 2-3 days.  You have a fever and your symptoms suddenly get worse. MAKE SURE YOU:   Understand these instructions.  Will watch your condition.  Will get help right away if you are not doing well or get worse. Document Released: 05/28/2005 Document Revised: 12/30/2011 Document Reviewed:  11/19/2011 [facility] Patient Information 2015 [place], [place]. This information is not intended to replace advice given to you by your health care provider. Make sure you discuss any questions you have with your health care provider.  Acetaminophen; Oxycodone tablets What is this medicine? ACETAMINOPHEN; OXYCODONE (a set a MEE noe fen; ox i KOE done) is a pain reliever. It is used to treat mild to moderate pain. This medicine may be used for other purposes; ask your health care provider or pharmacist if you have questions. COMMON BRAND NAME(S): Endocet, Magnacet, Narvox, Percocet, Perloxx, Primalev, Primlev, Roxicet, Xolox What should I tell my health care provider before I take this medicine? They need to know if you have any of these conditions: -brain tumor -Crohn's disease, inflammatory bowel disease, or ulcerative colitis -drug abuse or addiction -head injury -heart or circulation problems -if you often drink alcohol -kidney disease or problems going to the bathroom -liver disease -lung disease, asthma, or breathing problems -an unusual or allergic reaction to acetaminophen, oxycodone, other opioid analgesics, other medicines, foods, dyes, or preservatives -pregnant or trying to get pregnant -breast-feeding How should I use this medicine? Take this medicine by mouth with a full glass of water. Follow the directions on the prescription label. Take your medicine at regular intervals. Do not take your medicine more often than directed. Talk to your pediatrician regarding the use of this medicine in children. Special care may be needed. Patients over  79 years old may have a stronger reaction and need a smaller dose. Overdosage: If you think you have taken too much of this medicine contact a poison control center or emergency room at once. NOTE: This medicine is only for you. Do not share this medicine with others. What if I miss a dose? If you miss a dose, take it as soon as you can. If  it is almost time for your next dose, take only that dose. Do not take double or extra doses. What may interact with this medicine? -alcohol -antihistamines -barbiturates like amobarbital, butalbital, butabarbital, methohexital, pentobarbital, phenobarbital, thiopental, and secobarbital -benztropine -drugs for bladder problems like solifenacin, trospium, oxybutynin, tolterodine, hyoscyamine, and methscopolamine -drugs for breathing problems like ipratropium and tiotropium -drugs for certain stomach or intestine problems like propantheline, homatropine methylbromide, glycopyrrolate, atropine, belladonna, and dicyclomine -general anesthetics like etomidate, ketamine, nitrous oxide, propofol, desflurane, enflurane, halothane, isoflurane, and sevoflurane -medicines for depression, anxiety, or psychotic disturbances -medicines for sleep -muscle relaxants -naltrexone -narcotic medicines (opiates) for pain -phenothiazines like perphenazine, thioridazine, chlorpromazine, mesoridazine, fluphenazine, prochlorperazine, promazine, and trifluoperazine -scopolamine -tramadol -trihexyphenidyl This list may not describe all possible interactions. Give your health care provider a list of all the medicines, herbs, non-prescription drugs, or dietary supplements you use. Also tell them if you smoke, drink alcohol, or use illegal drugs. Some items may interact with your medicine. What should I watch for while using this medicine? Tell your doctor or health care professional if your pain does not go away, if it gets worse, or if you have new or a different type of pain. You may develop tolerance to the medicine. Tolerance means that you will need a higher dose of the medication for pain relief. Tolerance is normal and is expected if you take this medicine for a long time. Do not suddenly stop taking your medicine because you may develop a severe reaction. Your body becomes used to the medicine. This does NOT mean  you are addicted. Addiction is a behavior related to getting and using a drug for a non-medical reason. If you have pain, you have a medical reason to take pain medicine. Your doctor will tell you how much medicine to take. If your doctor wants you to stop the medicine, the dose will be slowly lowered over time to avoid any side effects. You may get drowsy or dizzy. Do not drive, use machinery, or do anything that needs mental alertness until you know how this medicine affects you. Do not stand or sit up quickly, especially if you are an older patient. This reduces the risk of dizzy or fainting spells. Alcohol may interfere with the effect of this medicine. Avoid alcoholic drinks. There are different types of narcotic medicines (opiates) for pain. If you take more than one type at the same time, you may have more side effects. Give your health care provider a list of all medicines you use. Your doctor will tell you how much medicine to take. Do not take more medicine than directed. Call emergency for help if you have problems breathing. The medicine will cause constipation. Try to have a bowel movement at least every 2 to 3 days. If you do not have a bowel movement for 3 days, call your doctor or health care professional. Do not take Tylenol (acetaminophen) or medicines that have acetaminophen with this medicine. Too much acetaminophen can be very dangerous. Many nonprescription medicines contain acetaminophen. Always read the labels carefully to avoid taking more acetaminophen. What side effects  may I notice from receiving this medicine? Side effects that you should report to your doctor or health care professional as soon as possible: -allergic reactions like skin rash, itching or hives, swelling of the face, lips, or tongue -breathing difficulties, wheezing -confusion -light headedness or fainting spells -severe stomach pain -unusually weak or tired -yellowing of the skin or the whites of the  eyes Side effects that usually do not require medical attention (report to your doctor or health care professional if they continue or are bothersome): -dizziness -drowsiness -nausea -vomiting This list may not describe all possible side effects. Call your doctor for medical advice about side effects. You may report side effects to FDA at 1-800-FDA-1088. Where should I keep my medicine? Keep out of the reach of children. This medicine can be abused. Keep your medicine in a safe place to protect it from theft. Do not share this medicine with anyone. Selling or giving away this medicine is dangerous and against the law. Store at room temperature between 20 and 25 degrees C (68 and 77 degrees F). Keep container tightly closed. Protect from light. This medicine may cause accidental overdose and death if it is taken by other adults, children, or pets. Flush any unused medicine down the toilet to reduce the chance of harm. Do not use the medicine after the expiration date. NOTE: This sheet is a summary. It may not cover all possible information. If you have questions about this medicine, talk to your doctor, pharmacist, or health care provider.  2015, [employer]. (2012-11-28 13:17:35)

## 2015-05-04 ENCOUNTER — Encounter (HOSPITAL_COMMUNITY): Payer: Self-pay

## 2015-05-04 ENCOUNTER — Emergency Department (HOSPITAL_COMMUNITY)
Admission: EM | Admit: 2015-05-04 | Discharge: 2015-05-04 | Disposition: A | Discharge status: Home / Self Care | Payer: BLUE CROSS/BLUE SHIELD | Attending: Emergency Medicine | Admitting: Emergency Medicine

## 2015-05-04 DIAGNOSIS — Z87442 Personal history of urinary calculi: Secondary | ICD-10-CM | POA: Insufficient documentation

## 2015-05-04 DIAGNOSIS — G43909 Migraine, unspecified, not intractable, without status migrainosus: Secondary | ICD-10-CM | POA: Insufficient documentation

## 2015-05-04 DIAGNOSIS — F17210 Nicotine dependence, cigarettes, uncomplicated: Secondary | ICD-10-CM | POA: Insufficient documentation

## 2015-05-04 DIAGNOSIS — Z8739 Personal history of other diseases of the musculoskeletal system and connective tissue: Secondary | ICD-10-CM | POA: Insufficient documentation

## 2015-05-04 DIAGNOSIS — M545 Low back pain: Secondary | ICD-10-CM | POA: Insufficient documentation

## 2015-05-04 LAB — URINALYSIS, ROUTINE W REFLEX MICROSCOPIC
Bilirubin Urine: NEGATIVE
Glucose, UA: NEGATIVE mg/dL
Hgb urine dipstick: NEGATIVE
Ketones, ur: NEGATIVE mg/dL
Leukocytes, UA: NEGATIVE
Nitrite: NEGATIVE
Protein, ur: NEGATIVE mg/dL
Specific Gravity, Urine: 1.012 (ref 1.005–1.030)
pH: 6 (ref 5.0–8.0)

## 2015-05-04 MED ORDER — HYDROMORPHONE HCL 1 MG/ML IJ SOLN
1.0000 mg | Freq: Once | INTRAMUSCULAR | Status: AC
  Administered 2015-05-04: 1 mg via INTRAVENOUS
  Filled 2015-05-04: qty 1

## 2015-05-04 MED ORDER — DIAZEPAM 5 MG/ML IJ SOLN
5.0000 mg | Freq: Once | INTRAMUSCULAR | Status: AC
  Administered 2015-05-04: 5 mg via INTRAVENOUS
  Filled 2015-05-04: qty 2

## 2015-05-04 MED ORDER — SODIUM CHLORIDE 0.9 % IV BOLUS (SEPSIS)
1000.0000 mL | Freq: Once | INTRAVENOUS | Status: AC
  Administered 2015-05-04: 1000 mL via INTRAVENOUS

## 2015-05-04 MED ORDER — KETOROLAC TROMETHAMINE 15 MG/ML IJ SOLN
15.0000 mg | Freq: Once | INTRAMUSCULAR | Status: AC
  Administered 2015-05-04: 15 mg via INTRAVENOUS
  Filled 2015-05-04: qty 1

## 2015-05-04 MED ORDER — MELOXICAM 7.5 MG PO TABS: 7.5000 mg | ORAL_TABLET | Freq: Every day | ORAL | Status: AC | PRN

## 2015-05-04 MED ORDER — TRAMADOL HCL 50 MG PO TABS: 50.0000 mg | ORAL_TABLET | Freq: Four times a day (QID) | ORAL | Status: AC | PRN

## 2015-05-04 MED ORDER — DEXAMETHASONE SODIUM PHOSPHATE 10 MG/ML IJ SOLN
10.0000 mg | Freq: Once | INTRAMUSCULAR | Status: AC
  Administered 2015-05-04: 10 mg via INTRAVENOUS
  Filled 2015-05-04: qty 1

## 2015-05-04 NOTE — ED Provider Notes (Signed)
CSN: [id number]     Arrival date & time 05/04/15  1213 History   First MD Initiated Contact with Patient 05/04/15 1231     Chief Complaint  Patient presents with  . Back Pain     (Consider location/radiation/quality/duration/timing/severity/associated sxs/prior Treatment) HPI   35 year-old female with lower back pain. Gradual onset about 2 days ago. She first noticed mild pain in her lower back after getting off work. When she woke up this morning she noticed sniffily worsened pain in the same area as she was getting out of bed. The pain is in her mid to lower back. She denies any radiation. Subsequently worse with movement. No urinary complaints. No fevers or chills. Denies a past history of any back surgery. No blood thinners. No acute numbness, tingling or focal loss of strength.  Past Medical History  Diagnosis Date  . Migraines   . Sjogren's disease (HCC)   . Left ureteral calculus   . Renal calculus, bilateral   . Nocturia   . Urgency of urination   . Hematuria   . History of kidney stones    Past Surgical History  Procedure Laterality Date  . Cystoscopy w/ ureteral stent placement Left 05/05/2013    Procedure: CYSTOSCOPY WITH RETROGRADE PYELOGRAM/URETERAL STENT PLACEMENT LEFT;  Surgeon: Milford Cage, MD;  Location: [facility];  Service: Urology;  Laterality: Left;  . Cholecystectomy  2005  . Tubal ligation  2006  . Extracorporeal shock wave lithotripsy  2012  . Cystoscopy with retrograde pyelogram, ureteroscopy and stent placement Left 05/15/2013    Procedure: CYSTOSCOPY WITH RETROGRADE PYELOGRAM, URETEROSCOPY AND STENT EXCHANGE;  Surgeon: Milford Cage, MD;  Location: [facility];  Service: Urology;  Laterality: Left;  . Holmium laser application Left 05/15/2013    Procedure: HOLMIUM LASER APPLICATION;  Surgeon: Milford Cage, MD;  Location: [facility];  Service: Urology;  Laterality: Left;   Family History  Problem  Relation Age of Onset  . Hepatitis C Mother   . COPD Mother   . Hypertension Father   . Hyperlipidemia Father   . Asthma Brother    Social History  Substance Use Topics  . Smoking status: Heavy Tobacco Smoker -- 3 years    Types: Cigars  . Smokeless tobacco: Never Used     Comment: 1 TO 2 CIGAR PER DAY  . Alcohol Use: Yes     Comment: occasional   OB History    Gravida Para Term Preterm AB TAB SAB Ectopic Multiple Living   13 3   10           Review of Systems  All systems reviewed and negative, other than as noted in HPI.   Allergies  Review of patient's allergies indicates no known allergies.  Home Medications   Prior to Admission medications   Medication Sig Start Date End Date Taking? Authorizing Provider  ibuprofen (ADVIL,MOTRIN) 200 MG tablet Take 800 mg by mouth every 8 (eight) hours as needed for fever, headache, mild pain, moderate pain or cramping.    Yes Historical Provider, MD  SUMAtriptan (IMITREX) 100 MG tablet Take 100 mg by mouth every 2 (two) hours as needed for migraine.  05/02/13 05/04/15 Yes Historical Provider, MD  topiramate (TOPAMAX) 25 MG tablet Take 25 mg by mouth daily as needed (migraines).    Yes Historical Provider, MD  traMADol (ULTRAM) 50 MG tablet Take 50 mg by mouth every 6 (six) hours as needed. 01/01/15  Yes Historical Provider,  MD   BP 101/71 mmHg  Pulse 74  Temp(Src) 97.9 F (36.6 C) (Oral)  Resp 18  Ht 5\' 6"  (1.676 m)  Wt 170 lb (77.111 kg)  BMI 27.45 kg/m2  SpO2 100%  LMP 04/07/2015 Physical Exam  Constitutional: She appears well-developed and well-nourished. No distress.  Laying prone on stretcher  HENT:  Head: Normocephalic and atraumatic.  Eyes: Conjunctivae are normal. Right eye exhibits no discharge. Left eye exhibits no discharge.  Neck: Neck supple.  Cardiovascular: Normal rate, regular rhythm and normal heart sounds.  Exam reveals no gallop and no friction rub.   No murmur heard. Pulmonary/Chest: Effort normal and  breath sounds normal. No respiratory distress.  Abdominal: Soft. She exhibits no distension. There is no tenderness.  Musculoskeletal: She exhibits no edema or tenderness.  Back grossly normal to inspection. Tenderness to palpation in the upper lumbar/lower thoracic region paraspinally and in the midline. Does not seem significantly worse the midline.  Neurological: She is alert.  Skin: Skin is warm and dry.  Psychiatric: She has a normal mood and affect. Her behavior is normal. Thought content normal.  Nursing note and vitals reviewed.   ED Course  Procedures (including critical care time) Labs Review Labs Reviewed  URINALYSIS, ROUTINE W REFLEX MICROSCOPIC (NOT AT [facility])    Imaging Review No results found. I have personally reviewed and evaluated these images and lab results as part of my medical decision-making.   EKG Interpretation None      MDM   Final diagnoses:  Low back pain without sciatica, unspecified back pain laterality    35 year old female with likely musculoskeletal due to lower back pain. No concerning "red flags." Plan symptomatic treatment.    Raeford RazorStephen Triniti Gruetzmacher, MD 05/05/15 [phone]

## 2015-05-04 NOTE — ED Notes (Signed)
Pt home with family after verbalizes understanding of instructions  

## 2015-05-04 NOTE — ED Notes (Signed)
Pt c/o low back pain x 2 days. Hx of similar episode 2 months ago.

## 2015-05-04 NOTE — Discharge Instructions (Signed)

## 2015-05-04 NOTE — ED Notes (Signed)
Pt oob to br. Gait is steady but slow

## 2017-03-25 ENCOUNTER — Encounter (HOSPITAL_COMMUNITY): Payer: Self-pay | Admitting: Emergency Medicine

## 2017-03-25 ENCOUNTER — Other Ambulatory Visit: Payer: Self-pay

## 2017-03-25 ENCOUNTER — Emergency Department (HOSPITAL_COMMUNITY): Payer: Self-pay

## 2017-03-25 ENCOUNTER — Emergency Department (HOSPITAL_COMMUNITY)
Admission: EM | Admit: 2017-03-25 | Discharge: 2017-03-25 | Disposition: A | Discharge status: Home / Self Care | Payer: Self-pay | Attending: Emergency Medicine | Admitting: Emergency Medicine

## 2017-03-25 DIAGNOSIS — N10 Acute tubulo-interstitial nephritis: Secondary | ICD-10-CM | POA: Insufficient documentation

## 2017-03-25 DIAGNOSIS — Z791 Long term (current) use of non-steroidal anti-inflammatories (NSAID): Secondary | ICD-10-CM | POA: Insufficient documentation

## 2017-03-25 DIAGNOSIS — R112 Nausea with vomiting, unspecified: Secondary | ICD-10-CM | POA: Insufficient documentation

## 2017-03-25 DIAGNOSIS — N12 Tubulo-interstitial nephritis, not specified as acute or chronic: Secondary | ICD-10-CM

## 2017-03-25 DIAGNOSIS — R350 Frequency of micturition: Secondary | ICD-10-CM | POA: Insufficient documentation

## 2017-03-25 DIAGNOSIS — F17290 Nicotine dependence, other tobacco product, uncomplicated: Secondary | ICD-10-CM | POA: Insufficient documentation

## 2017-03-25 DIAGNOSIS — R1084 Generalized abdominal pain: Secondary | ICD-10-CM | POA: Insufficient documentation

## 2017-03-25 DIAGNOSIS — Z79899 Other long term (current) drug therapy: Secondary | ICD-10-CM | POA: Insufficient documentation

## 2017-03-25 LAB — URINALYSIS, ROUTINE W REFLEX MICROSCOPIC
BILIRUBIN URINE: NEGATIVE
Glucose, UA: NEGATIVE mg/dL
Ketones, ur: NEGATIVE mg/dL
Leukocytes, UA: NEGATIVE
Nitrite: POSITIVE — AB
PH: 6 (ref 5.0–8.0)
Protein, ur: 30 mg/dL — AB
Specific Gravity, Urine: 1.017 (ref 1.005–1.030)

## 2017-03-25 LAB — BASIC METABOLIC PANEL
ANION GAP: 7 (ref 5–15)
BUN: 6 mg/dL (ref 6–20)
CALCIUM: 9 mg/dL (ref 8.9–10.3)
CHLORIDE: 105 mmol/L (ref 101–111)
CO2: 25 mmol/L (ref 22–32)
CREATININE: 0.69 mg/dL (ref 0.44–1.00)
GFR calc Af Amer: >60 mL/min (ref >60)
GFR calc non Af Amer: >60 mL/min (ref >60)
GLUCOSE: 91 mg/dL (ref 65–99)
Potassium: 3.4 mmol/L — ABNORMAL LOW (ref 3.5–5.1)
Sodium: 137 mmol/L (ref 135–145)

## 2017-03-25 LAB — CBC
HCT: 37 % (ref 36.0–46.0)
HEMOGLOBIN: 12.6 g/dL (ref 12.0–15.0)
MCH: 26.6 pg (ref 26.0–34.0)
MCHC: 34.1 g/dL (ref 30.0–36.0)
MCV: 78.2 fL (ref 78.0–100.0)
Platelets: 129 10*3/uL — ABNORMAL LOW (ref 150–400)
RBC: 4.73 MIL/uL (ref 3.87–5.11)
RDW: 13.7 % (ref 11.5–15.5)
WBC: 3.7 10*3/uL — ABNORMAL LOW (ref 4.0–10.5)

## 2017-03-25 LAB — POC URINE PREG, ED: Preg Test, Ur: NEGATIVE

## 2017-03-25 MED ORDER — DEXTROSE 5 % IV SOLN
1.0000 g | Freq: Once | INTRAVENOUS | Status: AC
  Administered 2017-03-25: 1 g via INTRAVENOUS
  Filled 2017-03-25: qty 10

## 2017-03-25 MED ORDER — CEPHALEXIN 500 MG PO CAPS: 500.0000 mg | ORAL_CAPSULE | Freq: Three times a day (TID) | ORAL | 0 refills | Status: AC

## 2017-03-25 MED ORDER — OXYCODONE-ACETAMINOPHEN 5-325 MG PO TABS
1.0000 | ORAL_TABLET | ORAL | Status: DC | PRN
  Administered 2017-03-25: 1 via ORAL
  Filled 2017-03-25: qty 1

## 2017-03-25 MED ORDER — ONDANSETRON 4 MG PO TBDP: 4.0000 mg | ORAL_TABLET | Freq: Three times a day (TID) | ORAL | 0 refills | Status: DC | PRN

## 2017-03-25 MED ORDER — GI COCKTAIL ~~LOC~~
30.0000 mL | Freq: Once | ORAL | Status: AC
  Administered 2017-03-25: 30 mL via ORAL
  Filled 2017-03-25: qty 30

## 2017-03-25 MED ORDER — ONDANSETRON HCL 4 MG/2ML IJ SOLN
4.0000 mg | Freq: Once | INTRAMUSCULAR | Status: AC
  Administered 2017-03-25: 4 mg via INTRAVENOUS
  Filled 2017-03-25: qty 2

## 2017-03-25 MED ORDER — SODIUM CHLORIDE 0.9 % IV BOLUS (SEPSIS)
1000.0000 mL | Freq: Once | INTRAVENOUS | Status: AC
  Administered 2017-03-25: 1000 mL via INTRAVENOUS

## 2017-03-25 MED ORDER — ONDANSETRON 4 MG PO TBDP
4.0000 mg | ORAL_TABLET | Freq: Once | ORAL | Status: AC
  Administered 2017-03-25: 4 mg via ORAL
  Filled 2017-03-25: qty 1

## 2017-03-25 MED ORDER — KETOROLAC TROMETHAMINE 15 MG/ML IJ SOLN
15.0000 mg | Freq: Once | INTRAMUSCULAR | Status: AC
  Administered 2017-03-25: 15 mg via INTRAVENOUS
  Filled 2017-03-25: qty 1

## 2017-03-25 MED ORDER — NAPROXEN 500 MG PO TABS: 500.0000 mg | ORAL_TABLET | Freq: Two times a day (BID) | ORAL | 0 refills | Status: AC

## 2017-03-25 NOTE — ED Triage Notes (Signed)
Pt presents to ED for assessment of back pain (mostly to the left), burning and frequency, hx of kidney stones.  Pt c/o nausea and vomiting, denies diarrhea.  Denies blood in urine.

## 2017-03-25 NOTE — ED Provider Notes (Signed)
[facility] EMERGENCY DEPARTMENT Provider Note   CSN: [id number] Arrival date & time: 03/25/17  1732     History   Chief Complaint Chief Complaint  Patient presents with  . Flank Pain  . Urinary Urgency    HPI Christine Bautista is a 36 y.o. female.  HPI   36 year old female with a history of nephrocalcinosis, nephrolithiasis, Sjogren's disease, presents with concern for flank pain and dysuria.  Reports that her symptoms started about 1 week ago, is urinary frequency and dysuria, and over the last 2 days, she has developed flank pain.  Reports vomiting today.  Reports she is also had a few associated loose stool.  Reports that the flank pain is worse on the left side, however is present bilaterally.  Reports it is a constant pain, however it waxes and wanes and feels stabbing at times.  Reports that it feels similar to her prior symptoms of nephrolithiasis.  Denies known fevers.   Past Medical History:  Diagnosis Date  . Hematuria   . History of kidney stones   . Left ureteral calculus   . Migraines   . Nocturia   . Renal calculus, bilateral   . Sjogren's disease (HCC)   . Urgency of urination     There are no active problems to display for this patient.   Past Surgical History:  Procedure Laterality Date  . CHOLECYSTECTOMY  2005  . CYSTOSCOPY W/ URETERAL STENT PLACEMENT Left 05/05/2013   Procedure: CYSTOSCOPY WITH RETROGRADE PYELOGRAM/URETERAL STENT PLACEMENT LEFT;  Surgeon: Milford Cage, MD;  Location: [facility];  Service: Urology;  Laterality: Left;  . CYSTOSCOPY WITH RETROGRADE PYELOGRAM, URETEROSCOPY AND STENT PLACEMENT Left 05/15/2013   Procedure: CYSTOSCOPY WITH RETROGRADE PYELOGRAM, URETEROSCOPY AND STENT EXCHANGE;  Surgeon: Milford Cage, MD;  Location: [facility];  Service: Urology;  Laterality: Left;  . EXTRACORPOREAL SHOCK WAVE LITHOTRIPSY  2012  . HOLMIUM LASER APPLICATION Left 05/15/2013   Procedure: HOLMIUM LASER  APPLICATION;  Surgeon: Milford Cage, MD;  Location: [facility];  Service: Urology;  Laterality: Left;  . TUBAL LIGATION  2006    OB History    Gravida Para Term Preterm AB Living   13 3     10      SAB TAB Ectopic Multiple Live Births                   Home Medications    Prior to Admission medications   Medication Sig Start Date End Date Taking? Authorizing Provider  cephALEXin (KEFLEX) 500 MG capsule Take 1 capsule (500 mg total) by mouth 3 (three) times daily for 14 days. 03/25/17 04/08/17  Alvira Monday, MD  ibuprofen (ADVIL,MOTRIN) 200 MG tablet Take 800 mg by mouth every 8 (eight) hours as needed for fever, headache, mild pain, moderate pain or cramping.     [provider]  meloxicam (MOBIC) 7.5 MG tablet Take 1 tablet (7.5 mg total) by mouth daily as needed for pain. 05/04/15   Raeford Razor, MD  naproxen (NAPROSYN) 500 MG tablet Take 1 tablet (500 mg total) by mouth 2 (two) times daily with a meal for 7 days. 03/25/17 04/01/17  Alvira Monday, MD  ondansetron (ZOFRAN ODT) 4 MG disintegrating tablet Take 1 tablet (4 mg total) by mouth every 8 (eight) hours as needed for nausea or vomiting. 03/25/17   Alvira Monday, MD  SUMAtriptan (IMITREX) 100 MG tablet Take 100 mg by mouth every 2 (two) hours as needed  for migraine.  05/02/13 05/04/15  [provider]  topiramate (TOPAMAX) 25 MG tablet Take 25 mg by mouth daily as needed (migraines).     [provider]  traMADol (ULTRAM) 50 MG tablet Take 50 mg by mouth every 6 (six) hours as needed. 01/01/15   [provider]  traMADol (ULTRAM) 50 MG tablet Take 1 tablet (50 mg total) by mouth every 6 (six) hours as needed. 05/04/15   Raeford Razor, MD    Family History Family History  Problem Relation Age of Onset  . Hepatitis C Mother   . COPD Mother   . Hypertension Father   . Hyperlipidemia Father   . Asthma Brother     Social History Social History   Tobacco Use    . Smoking status: Heavy Tobacco Smoker    Years: 3.00    Types: Cigars  . Smokeless tobacco: Never Used  . Tobacco comment: 1 TO 2 CIGAR PER DAY  Substance Use Topics  . Alcohol use: Yes    Comment: occasional  . Drug use: No     Allergies   Patient has no known allergies.   Review of Systems Review of Systems  Constitutional: Negative for fever.  HENT: Negative for sore throat.   Eyes: Negative for visual disturbance.  Respiratory: Negative for cough and shortness of breath.   Cardiovascular: Negative for chest pain.  Gastrointestinal: Positive for abdominal pain, nausea and vomiting.  Genitourinary: Positive for flank pain. Negative for difficulty urinating.  Musculoskeletal: Negative for back pain and neck pain.  Skin: Negative for rash.  Neurological: Negative for syncope and headaches.     Physical Exam Updated Vital Signs BP 114/74 (BP Location: Left Arm)   Pulse (!) 56   Temp 98.4 F (36.9 C) (Oral)   Resp 16   LMP 03/14/2017   SpO2 100%   Physical Exam  Constitutional: She is oriented to person, place, and time. She appears well-developed and well-nourished. No distress.  HENT:  Head: Normocephalic and atraumatic.  Eyes: Conjunctivae and EOM are normal.  Neck: Normal range of motion.  Cardiovascular: Normal rate, regular rhythm, normal heart sounds and intact distal pulses. Exam reveals no gallop and no friction rub.  No murmur heard. Pulmonary/Chest: Effort normal and breath sounds normal. No respiratory distress. She has no wheezes. She has no rales.  Abdominal: Soft. She exhibits no distension. There is tenderness (suprapubic). There is no guarding.  Bilateral CVA tenderness   Musculoskeletal: She exhibits no edema or tenderness.  Neurological: She is alert and oriented to person, place, and time.  Skin: Skin is warm and dry. No rash noted. She is not diaphoretic. No erythema.  Nursing note and vitals reviewed.    ED Treatments / Results   Labs (all labs ordered are listed, but only abnormal results are displayed) Labs Reviewed  URINALYSIS, ROUTINE W REFLEX MICROSCOPIC - Abnormal; Notable for the following components:      Result Value   Color, Urine AMBER (*)    APPearance HAZY (*)    Hgb urine dipstick LARGE (*)    Protein, ur 30 (*)    Nitrite POSITIVE (*)    Bacteria, UA RARE (*)    Squamous Epithelial / LPF 0-5 (*)    All other components within normal limits  CBC - Abnormal; Notable for the following components:   WBC 3.7 (*)    Platelets 129 (*)    All other components within normal limits  BASIC METABOLIC PANEL - Abnormal;  Notable for the following components:   Potassium 3.4 (*)    All other components within normal limits  URINE CULTURE  HEPATIC FUNCTION PANEL  LIPASE, BLOOD  POC URINE PREG, ED    EKG  EKG Interpretation None       Radiology Ct Renal Stone Study  Result Date: 03/25/2017 CLINICAL DATA:  Bilateral flank pain, dysuria, increased frequency, nausea and vomiting. History kidney stones. EXAM: CT ABDOMEN AND PELVIS WITHOUT CONTRAST TECHNIQUE: Multidetector CT imaging of the abdomen and pelvis was performed following the standard protocol without IV contrast. COMPARISON:  CT abdomen and pelvis December 30, 2014 FINDINGS: LOWER CHEST: Lung bases are clear. RIGHT middle lobe unchanged pneumatocele. The visualized heart size is normal. No pericardial effusion. HEPATOBILIARY: Status post cholecystectomy. PANCREAS: Normal. SPLEEN: Normal. ADRENALS/URINARY TRACT: Kidneys are orthotopic, demonstrating normal size and morphology. Wispy calcifications bilateral medullary pyramids with additional bilateral nephrolithiasis measuring to 2-3 mm. No hydronephrosis; limited assessment for renal masses on this nonenhanced examination. 5 mm proximal LEFT ureteral calculus. Decompressed containing 5 mm LEFT bladder calculus. Normal adrenal glands. STOMACH/BOWEL: The stomach, small and large bowel are normal in  course and caliber without inflammatory changes, sensitivity decreased by lack of enteric contrast. Mild cecal diverticulosis. Normal appendix. VASCULAR/LYMPHATIC: Aortoiliac vessels are normal in course and caliber. Calcified granulomas or lymph nodes in RIGHT upper quadrant, unchanged. No lymphadenopathy by CT size criteria. REPRODUCTIVE: Normal. Status post tubal ligation. RIGHT clip as migrated to the pelvic cul-de-sac. OTHER: No intraperitoneal free fluid or free air. MUSCULOSKELETAL: Non-acute. IMPRESSION: 1. 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 2. 5 mm proximal LEFT ureteral calculus. 5 mm LEFT bladder calculus. 3. Migrated RIGHT tubal ligation clip. Recommend gynecologic consultation on nonemergent basis. Electronically Signed   By: Awilda Metroourtnay  Bloomer M.D.   On: 03/25/2017 22:43    Procedures Procedures (including critical care time)  Medications Ordered in ED Medications  oxyCODONE-acetaminophen (PERCOCET/ROXICET) 5-325 MG per tablet 1 tablet (1 tablet Oral Given 03/25/17 1907)  ondansetron (ZOFRAN-ODT) disintegrating tablet 4 mg (4 mg Oral Given 03/25/17 1743)  sodium chloride 0.9 % bolus 1,000 mL (0 mLs Intravenous Stopped 03/25/17 2303)  ketorolac (TORADOL) 15 MG/ML injection 15 mg (15 mg Intravenous Given 03/25/17 2123)  ondansetron (ZOFRAN) injection 4 mg (4 mg Intravenous Given 03/25/17 2123)  cefTRIAXone (ROCEPHIN) 1 g in dextrose 5 % 50 mL IVPB (0 g Intravenous Stopped 03/25/17 2230)  gi cocktail (Maalox,Lidocaine,Donnatal) (30 mLs Oral Given 03/25/17 2315)     Initial Impression / Assessment and Plan / ED Course  I have reviewed the triage vital signs and the nursing notes.  Pertinent labs & imaging results that were available during my care of the patient were reviewed by me and considered in my medical decision making (see chart for details).     36 year old female with a history of nephrocalcinosis, nephrolithiasis, Sjogren's disease, presents with  concern for flank pain and dysuria.  Urinalysis concerning for urinary tract infection.  Given patient's history of nephrolithiasis, concern for this feeling similar, CT was done to evaluate for signs of nephrolithiasis or other complication of pyelonephritis.  CT completed shows no obstructing stones, but does show a bladder stone as well as renal stones.  She is hemodynamically stable with normal renal function.  She is given fluids, Toradol and Zofran with improvement of her flank pain.  Prior to discharge, does report she now has some epigastric pain after urination.  Her abdominal exam is soft, benign.  Ordered GI cocktail.  Discussed possibility of  checking a lipase/hepatic panel, however given patient's overall presentation of dysuria and flank pain over week and days, with new development of epigastric pain, as well CT not showing signs of pancreatitis, have low suspicion that her presentation is consistent with acute pancreatitis. She has had cholecystectomy.  Discussed with patient who feels comfortable with discharge at this time, return for any new or worsening symptoms.   Given rx for naproxen, zofran, keflex. Discussed reasons to return. Pt may follow up with PCP and urology. Patient discharged in stable condition with understanding of reasons to return.   Final Clinical Impressions(s) / ED Diagnoses   Final diagnoses:  Pyelonephritis    ED Discharge Orders        Ordered    cephALEXin (KEFLEX) 500 MG capsule  3 times daily     03/25/17 2300    ondansetron (ZOFRAN ODT) 4 MG disintegrating tablet  Every 8 hours PRN     03/25/17 2300    naproxen (NAPROSYN) 500 MG tablet  2 times daily with meals     03/25/17 2300       Alvira MondaySchlossman, Dann Ventress, MD 03/26/17 [phone]

## 2017-03-25 NOTE — ED Notes (Signed)
ED Provider at bedside. 

## 2017-03-27 LAB — URINE CULTURE

## 2017-09-14 ENCOUNTER — Other Ambulatory Visit: Payer: Self-pay

## 2017-09-14 ENCOUNTER — Emergency Department (HOSPITAL_COMMUNITY)
Admission: EM | Admit: 2017-09-14 | Discharge: 2017-09-14 | Disposition: A | Discharge status: Home / Self Care | Payer: Self-pay | Attending: Emergency Medicine | Admitting: Emergency Medicine

## 2017-09-14 ENCOUNTER — Encounter (HOSPITAL_COMMUNITY): Payer: Self-pay

## 2017-09-14 DIAGNOSIS — F17290 Nicotine dependence, other tobacco product, uncomplicated: Secondary | ICD-10-CM | POA: Insufficient documentation

## 2017-09-14 DIAGNOSIS — Z202 Contact with and (suspected) exposure to infections with a predominantly sexual mode of transmission: Secondary | ICD-10-CM | POA: Insufficient documentation

## 2017-09-14 LAB — URINALYSIS, ROUTINE W REFLEX MICROSCOPIC
BILIRUBIN URINE: NEGATIVE
Bacteria, UA: NONE SEEN
Glucose, UA: NEGATIVE mg/dL
Ketones, ur: 5 mg/dL — AB
LEUKOCYTES UA: NEGATIVE
NITRITE: NEGATIVE
PROTEIN: NEGATIVE mg/dL
RBC / HPF: >50 RBC/hpf — ABNORMAL HIGH (ref 0–5)
Specific Gravity, Urine: 1.015 (ref 1.005–1.030)
pH: 6 (ref 5.0–8.0)

## 2017-09-14 LAB — WET PREP, GENITAL
CLUE CELLS WET PREP: NONE SEEN
Sperm: NONE SEEN
Trich, Wet Prep: NONE SEEN
WBC WET PREP: NONE SEEN
Yeast Wet Prep HPF POC: NONE SEEN

## 2017-09-14 LAB — PREGNANCY, URINE: Preg Test, Ur: NEGATIVE

## 2017-09-14 MED ORDER — CEFTRIAXONE SODIUM 250 MG IJ SOLR
250.0000 mg | Freq: Once | INTRAMUSCULAR | Status: AC
  Administered 2017-09-14: 250 mg via INTRAMUSCULAR
  Filled 2017-09-14: qty 250

## 2017-09-14 MED ORDER — AZITHROMYCIN 250 MG PO TABS
1000.0000 mg | ORAL_TABLET | Freq: Once | ORAL | Status: AC
  Administered 2017-09-14: 1000 mg via ORAL
  Filled 2017-09-14: qty 4

## 2017-09-14 MED ORDER — LIDOCAINE HCL 1 % IJ SOLN
INTRAMUSCULAR | Status: AC
  Filled 2017-09-14: qty 20

## 2017-09-14 NOTE — ED Provider Notes (Signed)
[facility]-EMERGENCY DEPT Provider Note   CSN: [id number] Arrival date & time: 09/14/17  1741     History   Chief Complaint No chief complaint on file.   HPI Christine Bautista is a 37 y.o. female.  Patient presents to the emergency department with a chief complaint of exposure to gonorrhea.  Would like to be checked for STDs.  States that she had sex with multiple people over the weekend.  She reports some mild suprapubic pain.  She reports having had her tubes tied.  She denies any fever, chills, nausea, vomiting.  She denies any other associated symptoms.  The history is provided by the patient. No language interpreter was used.    Past Medical History:  Diagnosis Date  . Hematuria   . History of kidney stones   . Left ureteral calculus   . Migraines   . Nocturia   . Renal calculus, bilateral   . Sjogren's disease (HCC)   . Urgency of urination     There are no active problems to display for this patient.   Past Surgical History:  Procedure Laterality Date  . CHOLECYSTECTOMY  2005  . CYSTOSCOPY W/ URETERAL STENT PLACEMENT Left 05/05/2013   Procedure: CYSTOSCOPY WITH RETROGRADE PYELOGRAM/URETERAL STENT PLACEMENT LEFT;  Surgeon: Milford Cage, MD;  Location: [facility];  Service: Urology;  Laterality: Left;  . CYSTOSCOPY WITH RETROGRADE PYELOGRAM, URETEROSCOPY AND STENT PLACEMENT Left 05/15/2013   Procedure: CYSTOSCOPY WITH RETROGRADE PYELOGRAM, URETEROSCOPY AND STENT EXCHANGE;  Surgeon: Milford Cage, MD;  Location: [facility];  Service: Urology;  Laterality: Left;  . EXTRACORPOREAL SHOCK WAVE LITHOTRIPSY  2012  . HOLMIUM LASER APPLICATION Left 05/15/2013   Procedure: HOLMIUM LASER APPLICATION;  Surgeon: Milford Cage, MD;  Location: [facility];  Service: Urology;  Laterality: Left;  . TUBAL LIGATION  2006     OB History    Gravida  13   Para  3   Term      Preterm      AB  10   Living       SAB      TAB      Ectopic      Multiple      Live Births               Home Medications    Prior to Admission medications   Medication Sig Start Date End Date Taking? Authorizing Provider  ibuprofen (ADVIL,MOTRIN) 200 MG tablet Take 800 mg by mouth every 8 (eight) hours as needed for fever, headache, mild pain, moderate pain or cramping.     [provider]  meloxicam (MOBIC) 7.5 MG tablet Take 1 tablet (7.5 mg total) by mouth daily as needed for pain. 05/04/15   Raeford Razor, MD  ondansetron (ZOFRAN ODT) 4 MG disintegrating tablet Take 1 tablet (4 mg total) by mouth every 8 (eight) hours as needed for nausea or vomiting. 03/25/17   Alvira Monday, MD  SUMAtriptan (IMITREX) 100 MG tablet Take 100 mg by mouth every 2 (two) hours as needed for migraine.  05/02/13 05/04/15  [provider]  topiramate (TOPAMAX) 25 MG tablet Take 25 mg by mouth daily as needed (migraines).     [provider]  traMADol (ULTRAM) 50 MG tablet Take 50 mg by mouth every 6 (six) hours as needed. 01/01/15   [provider]  traMADol (ULTRAM) 50 MG tablet Take 1 tablet (50 mg total) by mouth every 6 (  six) hours as needed. 05/04/15   Raeford Razor, MD    Family History Family History  Problem Relation Age of Onset  . Hepatitis C Mother   . COPD Mother   . Hypertension Father   . Hyperlipidemia Father   . Asthma Brother     Social History Social History   Tobacco Use  . Smoking status: Heavy Tobacco Smoker    Years: 3.00    Types: Cigars  . Smokeless tobacco: Never Used  . Tobacco comment: 1 TO 2 CIGAR PER DAY  Substance Use Topics  . Alcohol use: Yes    Comment: occasional  . Drug use: No     Allergies   Patient has no known allergies.   Review of Systems Review of Systems  All other systems reviewed and are negative.    Physical Exam Updated Vital Signs BP (!) 128/91 (BP Location: Left Arm)   Pulse 76   Temp 99.1 F (37.3 C) (Oral)    Resp 15   SpO2 100%   Physical Exam  Constitutional: She is oriented to person, place, and time. No distress.  HENT:  Head: Normocephalic and atraumatic.  Eyes: Pupils are equal, round, and reactive to light. Conjunctivae and EOM are normal.  Neck: No tracheal deviation present.  Cardiovascular: Normal rate.  Pulmonary/Chest: Effort normal. No respiratory distress.  Abdominal: Soft.  Mild suprapubic discomfort  Musculoskeletal: Normal range of motion.  Neurological: She is alert and oriented to person, place, and time.  Skin: Skin is warm and dry. She is not diaphoretic.  Psychiatric: Judgment normal.  Nursing note and vitals reviewed.    ED Treatments / Results  Labs (all labs ordered are listed, but only abnormal results are displayed) Labs Reviewed  WET PREP, GENITAL  URINALYSIS, ROUTINE W REFLEX MICROSCOPIC  PREGNANCY, URINE  HIV ANTIBODY (ROUTINE TESTING)  RPR  GC/CHLAMYDIA PROBE AMP ([place]) NOT AT [facility]    EKG None  Radiology No results found.  Procedures Procedures (including critical care time)  Medications Ordered in ED Medications - No data to display   Initial Impression / Assessment and Plan / ED Course  I have reviewed the triage vital signs and the nursing notes.  Pertinent labs & imaging results that were available during my care of the patient were reviewed by me and considered in my medical decision making (see chart for details).     Patient here for STD screen.  She was exposed to gonorrhea.  Will give treatment.  Final Clinical Impressions(s) / ED Diagnoses   Final diagnoses:  STD exposure    ED Discharge Orders    None       Roxy Horseman, PA-C 09/15/17 1610    Pricilla Loveless, MD 09/22/17 [phone]

## 2017-09-14 NOTE — ED Triage Notes (Signed)
Patient here for possible STD check. Patient is not in any pain at time.

## 2017-09-15 LAB — HIV ANTIBODY (ROUTINE TESTING W REFLEX): HIV SCREEN 4TH GENERATION: NONREACTIVE

## 2017-09-15 LAB — GC/CHLAMYDIA PROBE AMP (~~LOC~~) NOT AT ARMC
Chlamydia: NEGATIVE
Neisseria Gonorrhea: NEGATIVE

## 2017-09-15 LAB — RPR: RPR Ser Ql: NONREACTIVE

## 2017-12-27 ENCOUNTER — Other Ambulatory Visit: Payer: Self-pay

## 2017-12-27 ENCOUNTER — Encounter (HOSPITAL_COMMUNITY): Payer: Self-pay | Admitting: *Deleted

## 2017-12-27 ENCOUNTER — Emergency Department (HOSPITAL_COMMUNITY)
Admission: EM | Admit: 2017-12-27 | Discharge: 2017-12-27 | Disposition: A | Discharge status: Home / Self Care | Payer: Medicaid Other | Attending: Emergency Medicine | Admitting: Emergency Medicine

## 2017-12-27 DIAGNOSIS — R109 Unspecified abdominal pain: Secondary | ICD-10-CM | POA: Insufficient documentation

## 2017-12-27 DIAGNOSIS — Z5321 Procedure and treatment not carried out due to patient leaving prior to being seen by health care provider: Secondary | ICD-10-CM | POA: Insufficient documentation

## 2017-12-27 LAB — URINALYSIS, ROUTINE W REFLEX MICROSCOPIC
BILIRUBIN URINE: NEGATIVE
GLUCOSE, UA: NEGATIVE mg/dL
Ketones, ur: NEGATIVE mg/dL
NITRITE: NEGATIVE
PROTEIN: NEGATIVE mg/dL
Specific Gravity, Urine: 1.019 (ref 1.005–1.030)
WBC, UA: >50 WBC/hpf — ABNORMAL HIGH (ref 0–5)
pH: 6 (ref 5.0–8.0)

## 2017-12-27 LAB — COMPREHENSIVE METABOLIC PANEL
ALBUMIN: 3.7 g/dL (ref 3.5–5.0)
ALK PHOS: 52 U/L (ref 38–126)
ALT: 19 U/L (ref 0–44)
AST: 21 U/L (ref 15–41)
Anion gap: 9 (ref 5–15)
BUN: 8 mg/dL (ref 6–20)
CALCIUM: 9.4 mg/dL (ref 8.9–10.3)
CO2: 26 mmol/L (ref 22–32)
CREATININE: 0.76 mg/dL (ref 0.44–1.00)
Chloride: 104 mmol/L (ref 98–111)
GFR calc Af Amer: >60 mL/min (ref >60)
GFR calc non Af Amer: >60 mL/min (ref >60)
GLUCOSE: 91 mg/dL (ref 70–99)
Potassium: 3.6 mmol/L (ref 3.5–5.1)
Sodium: 139 mmol/L (ref 135–145)
Total Bilirubin: 0.7 mg/dL (ref 0.3–1.2)
Total Protein: 7.7 g/dL (ref 6.5–8.1)

## 2017-12-27 LAB — CBC
HEMATOCRIT: 41.1 % (ref 36.0–46.0)
HEMOGLOBIN: 12.9 g/dL (ref 12.0–15.0)
MCH: 25.4 pg — ABNORMAL LOW (ref 26.0–34.0)
MCHC: 31.4 g/dL (ref 30.0–36.0)
MCV: 81.1 fL (ref 78.0–100.0)
Platelets: 164 10*3/uL (ref 150–400)
RBC: 5.07 MIL/uL (ref 3.87–5.11)
RDW: 13.6 % (ref 11.5–15.5)
WBC: 5.2 10*3/uL (ref 4.0–10.5)

## 2017-12-27 LAB — I-STAT BETA HCG BLOOD, ED (MC, WL, AP ONLY)

## 2017-12-27 LAB — LIPASE, BLOOD: Lipase: 28 U/L (ref 11–51)

## 2017-12-27 NOTE — ED Triage Notes (Addendum)
Pt reports lower abd pain that started yesterday. Has urinary frequency and dark colored urine. Denies vaginal symptoms but requests to be checked for STDs.

## 2017-12-27 NOTE — ED Notes (Signed)
Pt stated she was leaving, unable to stay and wait to see a doctor. Pt seen leaving ED with steady gait.

## 2019-03-24 ENCOUNTER — Other Ambulatory Visit: Payer: Self-pay

## 2019-03-24 DIAGNOSIS — Z20822 Contact with and (suspected) exposure to covid-19: Secondary | ICD-10-CM

## 2019-03-25 LAB — NOVEL CORONAVIRUS, NAA: SARS-CoV-2, NAA: NOT DETECTED

## 2020-03-29 ENCOUNTER — Encounter (HOSPITAL_COMMUNITY): Payer: Self-pay | Admitting: Emergency Medicine

## 2020-03-29 ENCOUNTER — Emergency Department (HOSPITAL_COMMUNITY): Payer: Medicaid Other

## 2020-03-29 ENCOUNTER — Emergency Department (HOSPITAL_COMMUNITY)
Admission: EM | Admit: 2020-03-29 | Discharge: 2020-03-29 | Disposition: A | Discharge status: Home / Self Care | Payer: Medicaid Other | Attending: Emergency Medicine | Admitting: Emergency Medicine

## 2020-03-29 ENCOUNTER — Other Ambulatory Visit: Payer: Self-pay

## 2020-03-29 DIAGNOSIS — Z87442 Personal history of urinary calculi: Secondary | ICD-10-CM | POA: Diagnosis not present

## 2020-03-29 DIAGNOSIS — F17290 Nicotine dependence, other tobacco product, uncomplicated: Secondary | ICD-10-CM | POA: Diagnosis not present

## 2020-03-29 DIAGNOSIS — N200 Calculus of kidney: Secondary | ICD-10-CM | POA: Diagnosis not present

## 2020-03-29 DIAGNOSIS — R109 Unspecified abdominal pain: Secondary | ICD-10-CM | POA: Diagnosis present

## 2020-03-29 LAB — I-STAT BETA HCG BLOOD, ED (MC, WL, AP ONLY): I-stat hCG, quantitative: <5 m[IU]/mL (ref <5)

## 2020-03-29 LAB — URINALYSIS, ROUTINE W REFLEX MICROSCOPIC
Bacteria, UA: NONE SEEN
Bilirubin Urine: NEGATIVE
Glucose, UA: NEGATIVE mg/dL
Ketones, ur: NEGATIVE mg/dL
Leukocytes,Ua: NEGATIVE
Nitrite: NEGATIVE
Protein, ur: 30 mg/dL — AB
RBC / HPF: >50 RBC/hpf — ABNORMAL HIGH (ref 0–5)
Specific Gravity, Urine: 1.019 (ref 1.005–1.030)
pH: 5 (ref 5.0–8.0)

## 2020-03-29 LAB — CBC WITH DIFFERENTIAL/PLATELET
Abs Immature Granulocytes: 0.04 10*3/uL (ref 0.00–0.07)
Basophils Absolute: 0 10*3/uL (ref 0.0–0.1)
Basophils Relative: 0 %
Eosinophils Absolute: 0 10*3/uL (ref 0.0–0.5)
Eosinophils Relative: 0 %
HCT: 39.8 % (ref 36.0–46.0)
Hemoglobin: 12.4 g/dL (ref 12.0–15.0)
Immature Granulocytes: 0 %
Lymphocytes Relative: 14 %
Lymphs Abs: 1.3 10*3/uL (ref 0.7–4.0)
MCH: 25.3 pg — ABNORMAL LOW (ref 26.0–34.0)
MCHC: 31.2 g/dL (ref 30.0–36.0)
MCV: 81.2 fL (ref 80.0–100.0)
Monocytes Absolute: 0.5 10*3/uL (ref 0.1–1.0)
Monocytes Relative: 6 %
Neutro Abs: 7.4 10*3/uL (ref 1.7–7.7)
Neutrophils Relative %: 80 %
Platelets: 159 10*3/uL (ref 150–400)
RBC: 4.9 MIL/uL (ref 3.87–5.11)
RDW: 14 % (ref 11.5–15.5)
WBC: 9.3 10*3/uL (ref 4.0–10.5)
nRBC: 0 % (ref 0.0–0.2)

## 2020-03-29 LAB — BASIC METABOLIC PANEL
Anion gap: 10 (ref 5–15)
BUN: 11 mg/dL (ref 6–20)
CO2: 24 mmol/L (ref 22–32)
Calcium: 9.1 mg/dL (ref 8.9–10.3)
Chloride: 103 mmol/L (ref 98–111)
Creatinine, Ser: 0.8 mg/dL (ref 0.44–1.00)
GFR, Estimated: >60 mL/min (ref >60)
Glucose, Bld: 111 mg/dL — ABNORMAL HIGH (ref 70–99)
Potassium: 3.2 mmol/L — ABNORMAL LOW (ref 3.5–5.1)
Sodium: 137 mmol/L (ref 135–145)

## 2020-03-29 MED ORDER — ONDANSETRON 4 MG PO TBDP
4.0000 mg | ORAL_TABLET | Freq: Once | ORAL | Status: AC
  Administered 2020-03-29: 4 mg via ORAL
  Filled 2020-03-29: qty 1

## 2020-03-29 MED ORDER — ONDANSETRON HCL 4 MG/2ML IJ SOLN
4.0000 mg | Freq: Once | INTRAMUSCULAR | Status: AC
  Administered 2020-03-29: 4 mg via INTRAVENOUS
  Filled 2020-03-29: qty 2

## 2020-03-29 MED ORDER — ONDANSETRON 4 MG PO TBDP: 4.0000 mg | ORAL_TABLET | Freq: Three times a day (TID) | ORAL | 0 refills | Status: AC | PRN

## 2020-03-29 MED ORDER — MORPHINE SULFATE (PF) 4 MG/ML IV SOLN
4.0000 mg | Freq: Once | INTRAVENOUS | Status: AC
  Administered 2020-03-29: 4 mg via INTRAVENOUS
  Filled 2020-03-29: qty 1

## 2020-03-29 MED ORDER — HYDROCODONE-ACETAMINOPHEN 5-325 MG PO TABS: 1.0000 | ORAL_TABLET | Freq: Four times a day (QID) | ORAL | 0 refills | Status: AC | PRN

## 2020-03-29 MED ORDER — OXYCODONE-ACETAMINOPHEN 5-325 MG PO TABS
1.0000 | ORAL_TABLET | Freq: Once | ORAL | Status: AC
  Administered 2020-03-29: 1 via ORAL
  Filled 2020-03-29: qty 1

## 2020-03-29 NOTE — ED Provider Notes (Signed)
[facility] EMERGENCY DEPARTMENT Provider Note   CSN: [id number] Arrival date & time: 03/29/20  4098     History Chief Complaint  Patient presents with  . Flank Pain    Christine Bautista is a 39 y.o. female.  HPI Patient with multiple medical issues including Sjogren's disease, kidney stones, diverticulitis, presents with acute onset left abdominal pain, nausea, vomiting. She notes that after an episode of diverticulitis about 1 month ago she was recovering generally well.  However, yesterday about 12 hours ago she developed pain in her left lateral abdomen. Since that time the pain has been persistent, sharp. There is associated nausea, vomiting, p.o. intolerance. This latter complication is notable for her inability to achieve relief with oral narcotics. No new fever. No appreciable change in bowel movements recently. No other recent change in medication, diet, activity, and no ongoing steroids for her Sjogren's disease. After the initial evaluation I reviewed patient's chart and additional details are obtained on EMR, including records from [facility] hospital system with evaluation on with ago, CT scan at that point demonstrating left-sided proximal ureter stone, diagnosis with kidney stones, diverticulitis.     Past Medical History:  Diagnosis Date  . Hematuria   . History of kidney stones   . Left ureteral calculus   . Migraines   . Nocturia   . Renal calculus, bilateral   . Sjogren's disease (HCC)   . Urgency of urination     There are no problems to display for this patient.   Past Surgical History:  Procedure Laterality Date  . CHOLECYSTECTOMY  2005  . CYSTOSCOPY W/ URETERAL STENT PLACEMENT Left 05/05/2013   Procedure: CYSTOSCOPY WITH RETROGRADE PYELOGRAM/URETERAL STENT PLACEMENT LEFT;  Surgeon: Milford Cage, MD;  Location: [facility];  Service: Urology;  Laterality: Left;  . CYSTOSCOPY WITH RETROGRADE PYELOGRAM, URETEROSCOPY AND STENT  PLACEMENT Left 05/15/2013   Procedure: CYSTOSCOPY WITH RETROGRADE PYELOGRAM, URETEROSCOPY AND STENT EXCHANGE;  Surgeon: Milford Cage, MD;  Location: [facility];  Service: Urology;  Laterality: Left;  . EXTRACORPOREAL SHOCK WAVE LITHOTRIPSY  2012  . HOLMIUM LASER APPLICATION Left 05/15/2013   Procedure: HOLMIUM LASER APPLICATION;  Surgeon: Milford Cage, MD;  Location: [facility];  Service: Urology;  Laterality: Left;  . TUBAL LIGATION  2006     OB History    Gravida  14   Para  3   Term      Preterm      AB  10   Living        SAB      IAB      Ectopic      Multiple      Live Births              Family History  Problem Relation Age of Onset  . Hepatitis C Mother   . COPD Mother   . Hypertension Father   . Hyperlipidemia Father   . Asthma Brother     Social History   Tobacco Use  . Smoking status: Heavy Tobacco Smoker    Years: 3.00    Types: Cigars  . Smokeless tobacco: Never Used  . Tobacco comment: 1 TO 2 CIGAR PER DAY  Substance Use Topics  . Alcohol use: Yes    Comment: occasional  . Drug use: No    Home Medications Prior to Admission medications   Medication Sig Start Date End Date Taking? Authorizing Provider  ibuprofen (ADVIL,MOTRIN) 200 MG tablet  Take 800 mg by mouth every 8 (eight) hours as needed for fever, headache, mild pain, moderate pain or cramping.     [provider]  meloxicam (MOBIC) 7.5 MG tablet Take 1 tablet (7.5 mg total) by mouth daily as needed for pain. 05/04/15   Raeford Razor, MD  ondansetron (ZOFRAN ODT) 4 MG disintegrating tablet Take 1 tablet (4 mg total) by mouth every 8 (eight) hours as needed for nausea or vomiting. 03/25/17   Alvira Monday, MD  SUMAtriptan (IMITREX) 100 MG tablet Take 100 mg by mouth every 2 (two) hours as needed for migraine.  05/02/13 05/04/15  [provider]  topiramate (TOPAMAX) 25 MG tablet Take 25 mg by mouth daily as needed  (migraines).     [provider]  traMADol (ULTRAM) 50 MG tablet Take 50 mg by mouth every 6 (six) hours as needed. 01/01/15   [provider]  traMADol (ULTRAM) 50 MG tablet Take 1 tablet (50 mg total) by mouth every 6 (six) hours as needed. 05/04/15   Raeford Razor, MD    Allergies    Patient has no known allergies.  Review of Systems   Review of Systems  Constitutional:       Per HPI, otherwise negative  HENT:       Per HPI, otherwise negative  Respiratory:       Per HPI, otherwise negative  Cardiovascular:       Per HPI, otherwise negative  Gastrointestinal: Positive for abdominal pain, nausea and vomiting.  Endocrine:       Negative aside from HPI  Genitourinary:       Neg aside from HPI   Musculoskeletal:       Per HPI, otherwise negative  Skin: Negative.   Allergic/Immunologic:       Per HPI, Sjogren's  Neurological: Negative for syncope.    Physical Exam Updated Vital Signs BP 103/71   Pulse (!) 57   Temp 98.2 F (36.8 C)   Resp 20   Ht 5\' 6"  (1.676 m)   Wt 90 kg   LMP 03/15/2020   SpO2 97%   BMI 32.02 kg/m   Physical Exam Vitals and nursing note reviewed.  Constitutional:      General: She is not in acute distress.    Appearance: She is well-developed and well-nourished.  HENT:     Head: Normocephalic and atraumatic.  Eyes:     Extraocular Movements: EOM normal.     Conjunctiva/sclera: Conjunctivae normal.  Cardiovascular:     Rate and Rhythm: Normal rate and regular rhythm.  Pulmonary:     Effort: Pulmonary effort is normal. No respiratory distress.     Breath sounds: Normal breath sounds. No stridor.  Abdominal:     General: There is no distension.     Tenderness: There is abdominal tenderness. There is guarding.    Musculoskeletal:        General: No edema.  Skin:    General: Skin is warm and dry.  Neurological:     Mental Status: She is alert and oriented to person, place, and time.     Cranial Nerves: No cranial  nerve deficit.  Psychiatric:        Mood and Affect: Mood and affect normal.     ED Results / Procedures / Treatments   Labs (all labs ordered are listed, but only abnormal results are displayed) Labs Reviewed  CBC WITH DIFFERENTIAL/PLATELET - Abnormal; Notable for the following components:  Result Value   MCH 25.3 (*)    All other components within normal limits  BASIC METABOLIC PANEL - Abnormal; Notable for the following components:   Potassium 3.2 (*)    Glucose, Bld 111 (*)    All other components within normal limits  URINALYSIS, ROUTINE W REFLEX MICROSCOPIC - Abnormal; Notable for the following components:   APPearance HAZY (*)    Hgb urine dipstick LARGE (*)    Protein, ur 30 (*)    RBC / HPF >50 (*)    All other components within normal limits  I-STAT BETA HCG BLOOD, ED ([facility], [facility], [facility] ONLY)    EKG None  Radiology US Renal  Result Date: 03/29/2020 CLINICAL DATA:  Right flank pain for 24 hours. EXAM: RENAL / URINARY TRACT ULTRASOUND COMPLETE COMPARISON:  CT, 03/25/2017 FINDINGS: Right Kidney: Renal measurements: 10.8 x 4.1 x 5.1 cm = volume: 116.5 mL. Mild cortical thinning. Normal parenchymal echogenicity. Relative increased echogenicity of the renal sinus. No masses or stones. No hydronephrosis. Left Kidney: Renal measurements: 12.4 x 7.0 x 6.5 cm = volume: 292.4 mL. Normal parenchymal echogenicity. Echogenic renal sinuse. Moderate hydronephrosis. No mass or stone. Bladder: Appears normal for degree of bladder distention. Visualized right ureteral jet. No left ureteral jet. Other: None. IMPRESSION: 1. Moderate left hydronephrosis. Provided history describes right flank pain. Current findings would support a left ureteral obstruction, suspect left ureteral stone. This is supported by lack of a left ureteral jet noted at the bladder. Consider further assessment with unenhanced abdomen and pelvis CT. 2. 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  Electronically Signed   By: Amie Portland M.D.   On: 03/29/2020 10:26   CT Renal Stone Study  Result Date: 03/29/2020 CLINICAL DATA:  Left flank pain EXAM: CT ABDOMEN AND PELVIS WITHOUT CONTRAST TECHNIQUE: Multidetector CT imaging of the abdomen and pelvis was performed following the standard protocol without IV contrast. COMPARISON:  2018 FINDINGS: Lower chest: No acute abnormality. 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 Hepatobiliary: No focal liver abnormality is seen. Status post cholecystectomy. No biliary dilatation. Pancreas: Calcifications along the head of the pancreas present previously. New calcification along the body. Spleen: Unremarkable. Adrenals/Urinary Tract: Adrenals are unremarkable. Unchanged appearance of ill-defined medullary calcification bilaterally. There may be additional small nonobstructing renal calculi measuring up to 2 mm. There is an obstructing 3 mm calculus within the proximal left ureter. Proximal left hydroureteronephrosis is present. Bladder is unremarkable. Stomach/Bowel: Stomach is within normal limits. Bowel is normal in caliber. Normal appendix. Vascular/Lymphatic: No significant vascular findings on this noncontrast study. No enlarged lymph nodes. Reproductive: Uterus and bilateral adnexa are unremarkable. Other: No ascites.  Abdominal wall is unremarkable. Musculoskeletal: No significant or acute osseous abnormality. IMPRESSION: 3 mm obstructing proximal left ureteral calculus with left hydroureteronephrosis. Medullary nephrocalcinosis. Probable few superimposed nonobstructing renal calculi measuring up to 2 mm. Pancreatic calcifications. Given above, could reflect hyperparathyroidism. Electronically Signed   By: Guadlupe Spanish M.D.   On: 03/29/2020 11:55    Procedures Procedures (including critical care time)  Medications Ordered in ED Medications  oxyCODONE-acetaminophen (PERCOCET/ROXICET) 5-325 MG per tablet 1 tablet (1 tablet Oral Given 03/29/20  0250)  ondansetron (ZOFRAN-ODT) disintegrating tablet 4 mg (4 mg Oral Given 03/29/20 0254)  morphine 4 MG/ML injection 4 mg (4 mg Intravenous Given 03/29/20 0930)  ondansetron (ZOFRAN) injection 4 mg (4 mg Intravenous Given 03/29/20 0931)    ED Course  I have reviewed the triage vital signs and the nursing notes.  Pertinent labs & imaging results that were available during my care of the patient were reviewed by me and considered in my medical decision making (see chart for details).  Update:, Patient continues to have pain, though diminished since arrival. Ultrasound results discussed, reviewed, notable for hydronephrosis, left, with consideration of stone, lower suspicion for diverticulitis, CT recommendation was executed.    12:53 PM Reviewed the CT, discussed with the patient, who continues to have substantial reduction in pain since arrival. With concern for 3 mm stone, hydronephrosis, patient will follow up with her urologist, without evidence for concurrent infection, and with pain control achieved here she is appropriate for discharge with outpatient follow-up. MDM Number of Diagnoses or Management Options Nephrolithiasis: established, worsening   Amount and/or Complexity of Data Reviewed Clinical lab tests: reviewed Tests in the radiology section of CPT: reviewed Tests in the medicine section of CPT: reviewed Decide to obtain previous medical records or to obtain history from someone other than the patient: yes Review and summarize past medical records: yes Independent visualization of images, tracings, or specimens: yes  Risk of Complications, Morbidity, and/or Mortality Presenting problems: high Diagnostic procedures: high Management options: high  Critical Care Total time providing critical care: < 30 minutes  Patient Progress Patient progress: stable  Final Clinical Impression(s) / ED Diagnoses Final diagnoses:  Nephrolithiasis    Rx / DC Orders ED  Discharge Orders         Ordered    ondansetron (ZOFRAN ODT) 4 MG disintegrating tablet  Every 8 hours PRN        03/29/20 1258    HYDROcodone-acetaminophen (NORCO/VICODIN) 5-325 MG tablet  Every 6 hours PRN        03/29/20 1258           Gerhard MunchLockwood, Samer Dutton, MD 03/29/20 1259

## 2020-03-29 NOTE — ED Triage Notes (Signed)
Patient reports left flank pain with emesis onset yesterday , denies injury , no hematuria or fever .

## 2020-03-29 NOTE — Discharge Instructions (Signed)
As discussed, your evaluation today has been largely reassuring.  But, it is important that you monitor your condition carefully, and do not hesitate to return to the ED if you develop new, or concerning changes in your condition. ? ?Otherwise, please follow-up with your physician for appropriate ongoing care. ? ?

## 2021-02-25 ENCOUNTER — Ambulatory Visit (INDEPENDENT_AMBULATORY_CARE_PROVIDER_SITE_OTHER): Payer: Medicaid Other

## 2021-02-25 ENCOUNTER — Other Ambulatory Visit: Payer: Self-pay

## 2021-02-25 ENCOUNTER — Ambulatory Visit (HOSPITAL_COMMUNITY)
Admission: EM | Admit: 2021-02-25 | Discharge: 2021-02-25 | Disposition: A | Discharge status: Home / Self Care | Payer: Medicaid Other | Attending: Emergency Medicine | Admitting: Emergency Medicine

## 2021-02-25 ENCOUNTER — Encounter (HOSPITAL_COMMUNITY): Payer: Self-pay

## 2021-02-25 DIAGNOSIS — J101 Influenza due to other identified influenza virus with other respiratory manifestations: Secondary | ICD-10-CM | POA: Diagnosis not present

## 2021-02-25 DIAGNOSIS — R059 Cough, unspecified: Secondary | ICD-10-CM

## 2021-02-25 DIAGNOSIS — R0602 Shortness of breath: Secondary | ICD-10-CM

## 2021-02-25 DIAGNOSIS — J189 Pneumonia, unspecified organism: Secondary | ICD-10-CM | POA: Diagnosis not present

## 2021-02-25 MED ORDER — ALBUTEROL SULFATE HFA 108 (90 BASE) MCG/ACT IN AERS
INHALATION_SPRAY | RESPIRATORY_TRACT | Status: AC
  Filled 2021-02-25: qty 6.7

## 2021-02-25 MED ORDER — AMOXICILLIN-POT CLAVULANATE 875-125 MG PO TABS: 1.0000 | ORAL_TABLET | Freq: Two times a day (BID) | ORAL | 0 refills | Status: AC

## 2021-02-25 MED ORDER — ALBUTEROL SULFATE HFA 108 (90 BASE) MCG/ACT IN AERS
2.0000 | INHALATION_SPRAY | Freq: Once | RESPIRATORY_TRACT | Status: AC
  Administered 2021-02-25: 2 via RESPIRATORY_TRACT

## 2021-02-25 MED ORDER — AZITHROMYCIN 250 MG PO TABS: 250.0000 mg | ORAL_TABLET | Freq: Every day | ORAL | 0 refills | Status: AC

## 2021-02-25 NOTE — Discharge Instructions (Signed)
You can use the albuterol inhaler 2 puffs every 4-6 hours if needed for shortness of breath or wheezing.

## 2021-02-25 NOTE — ED Provider Notes (Signed)
[place]    CSN: [id number] Arrival date & time: 02/25/21  1732      History   Chief Complaint Chief Complaint  Patient presents with  . Shortness of Breath  . Cough    HPI Christine Bautista is a 40 y.o. female.  She had influenza last week, now has productive cough with yellow sputum; all other sx resolved.  She reports SOB, and pain in her upper left back on the inside.  She is concerned she may have pneumonia.  She had COVID and COVID-pneumonia 3 months ago, was treated with a Z-Pak at that time.    Shortness of Breath Associated symptoms: cough   Associated symptoms: no fever, no sore throat and no wheezing   Cough Associated symptoms: shortness of breath   Associated symptoms: no chills, no fever, no sore throat and no wheezing    Past Medical History:  Diagnosis Date  . Hematuria   . History of kidney stones   . Left ureteral calculus   . Migraines   . Nocturia   . Renal calculus, bilateral   . Sjogren's disease ([place])   . Urgency of urination     There are no problems to display for this patient.   Past Surgical History:  Procedure Laterality Date  . CHOLECYSTECTOMY  2005  . CYSTOSCOPY W/ URETERAL STENT PLACEMENT Left 05/05/2013   Procedure: CYSTOSCOPY WITH RETROGRADE PYELOGRAM/URETERAL STENT PLACEMENT LEFT;  Surgeon: Molli Hazard, MD;  Location: [facility];  Service: Urology;  Laterality: Left;  . CYSTOSCOPY WITH RETROGRADE PYELOGRAM, URETEROSCOPY AND STENT PLACEMENT Left 05/15/2013   Procedure: CYSTOSCOPY WITH RETROGRADE PYELOGRAM, URETEROSCOPY AND STENT EXCHANGE;  Surgeon: Molli Hazard, MD;  Location: [facility];  Service: Urology;  Laterality: Left;  . EXTRACORPOREAL SHOCK WAVE LITHOTRIPSY  2012  . HOLMIUM LASER APPLICATION Left [id number]   Procedure: HOLMIUM LASER APPLICATION;  Surgeon: Molli Hazard, MD;  Location: [facility];  Service: Urology;  Laterality: Left;  . TUBAL LIGATION  2006     OB History     Gravida  13   Para  3   Term      Preterm      AB  10   Living         SAB      IAB      Ectopic      Multiple      Live Births               Home Medications    Prior to Admission medications   Medication Sig Start Date End Date Taking? Authorizing Provider  amoxicillin-clavulanate (AUGMENTIN) 875-125 MG tablet Take 1 tablet by mouth every 12 (twelve) hours. 02/25/21  Yes Carvel Getting, NP  azithromycin (ZITHROMAX) 250 MG tablet Take 1 tablet (250 mg total) by mouth daily. Take first 2 tablets together, then 1 every day until finished. 02/25/21  Yes Carvel Getting, NP  HYDROcodone-acetaminophen (NORCO/VICODIN) 5-325 MG tablet Take 1 tablet by mouth every 6 (six) hours as needed for severe pain. 03/29/20   Carmin Muskrat, MD  ibuprofen (ADVIL,MOTRIN) 200 MG tablet Take 800 mg by mouth every 8 (eight) hours as needed for fever, headache, mild pain, moderate pain or cramping.     [provider]  meloxicam (MOBIC) 7.5 MG tablet Take 1 tablet (7.5 mg total) by mouth daily as needed for pain. 05/04/15   Virgel Manifold, MD  ondansetron (ZOFRAN ODT) 4 MG disintegrating  tablet Take 1 tablet (4 mg total) by mouth every 8 (eight) hours as needed for nausea or vomiting. 03/29/20   Carmin Muskrat, MD  SUMAtriptan (IMITREX) 100 MG tablet Take 100 mg by mouth every 2 (two) hours as needed for migraine.  05/02/13 05/04/15  [provider]  topiramate (TOPAMAX) 25 MG tablet Take 25 mg by mouth daily as needed (migraines).     [provider]  traMADol (ULTRAM) 50 MG tablet Take 50 mg by mouth every 6 (six) hours as needed. 01/01/15   [provider]  traMADol (ULTRAM) 50 MG tablet Take 1 tablet (50 mg total) by mouth every 6 (six) hours as needed. 05/04/15   Virgel Manifold, MD    Family History Family History  Problem Relation Age of Onset  . Hepatitis C Mother   . COPD Mother   . Hypertension Father   . Hyperlipidemia  Father   . Asthma Brother     Social History Social History   Tobacco Use  . Smoking status: Heavy Smoker    Types: Cigars  . Smokeless tobacco: Never  . Tobacco comments:    1 TO 2 CIGAR PER DAY  Substance Use Topics  . Alcohol use: Yes    Comment: occasional  . Drug use: No     Allergies   Patient has no known allergies.   Review of Systems Review of Systems  Constitutional:  Negative for chills and fever.  HENT:  Negative for congestion, postnasal drip and sore throat.   Respiratory:  Positive for cough, chest tightness and shortness of breath. Negative for wheezing.     Physical Exam Triage Vital Signs ED Triage Vitals  Enc Vitals Group     BP 02/25/21 1833 117/74     Pulse Rate 02/25/21 1833 67     Resp 02/25/21 1833 19     Temp 02/25/21 1833 99.2 F (37.3 C)     Temp Source 02/25/21 1833 Oral     SpO2 02/25/21 1833 100 %     Weight --      Height --      Head Circumference --      Peak Flow --      Pain Score 02/25/21 1831 3     Pain Loc --      Pain Edu? --      Excl. in [place]? --    No data found.  Updated Vital Signs BP 117/74 (BP Location: Right Arm)   Pulse 67   Temp 99.2 F (37.3 C) (Oral)   Resp 19   LMP 01/27/2021 (Approximate)   SpO2 100%   Visual Acuity Right Eye Distance:   Left Eye Distance:   Bilateral Distance:    Right Eye Near:   Left Eye Near:    Bilateral Near:     Physical Exam Constitutional:      Appearance: She is well-developed. She is not ill-appearing.  HENT:     Right Ear: Tympanic membrane, ear canal and external ear normal.     Left Ear: Tympanic membrane, ear canal and external ear normal.     Nose: No congestion or rhinorrhea.     Mouth/Throat:     Mouth: Mucous membranes are moist.     Pharynx: Oropharynx is clear.  Cardiovascular:     Rate and Rhythm: Normal rate and regular rhythm.  Pulmonary:     Effort: Pulmonary effort is normal.     Breath sounds: Examination of the right-lower field reveals  wheezing and rhonchi. Examination of the left-lower field reveals wheezing and rhonchi. Wheezing and rhonchi present.  Neurological:     Mental Status: She is alert.     UC Treatments / Results  Labs (all labs ordered are listed, but only abnormal results are displayed) Labs Reviewed - No data to display  EKG   Radiology DG Chest 2 View  Result Date: 02/25/2021 CLINICAL DATA:  Shortness of breath, cough, flu EXAM: CHEST - 2 VIEW COMPARISON:  07/26/2014 FINDINGS: Lungs are clear.  No pleural effusion or pneumothorax. The heart is normal in size. Visualized osseous structures are within normal limits. Cholecystectomy clips. IMPRESSION: Normal chest radiographs. Electronically Signed   By: Charline Bills M.D.   On: 02/25/2021 20:11    Procedures Procedures (including critical care time)  Medications Ordered in UC Medications  albuterol (VENTOLIN HFA) 108 (90 Base) MCG/ACT inhaler 2 puff (2 puffs Inhalation Given 02/25/21 1946)    Initial Impression / Assessment and Plan / UC Course  I have reviewed the triage vital signs and the nursing notes.  Pertinent labs & imaging results that were available during my care of the patient were reviewed by me and considered in my medical decision making (see chart for details).  Given an albuterol inhaler here in urgent care which greatly improved her symptoms.  Discussed with patient getting a chest x-ray versus just treating empirically for pneumonia.  Patient wanted a chest x-ray and treatment.  Prescribed Zithromax and Augmentin as she recently had antibiotics.   Final Clinical Impressions(s) / UC Diagnoses   Final diagnoses:  Community acquired pneumonia, unspecified laterality     Discharge Instructions      You can use the albuterol inhaler 2 puffs every 4-6 hours if needed for shortness of breath or wheezing.      ED Prescriptions     Medication Sig Dispense Auth. Provider   azithromycin (ZITHROMAX) 250 MG tablet Take 1  tablet (250 mg total) by mouth daily. Take first 2 tablets together, then 1 every day until finished. 6 tablet Cathlyn Parsons, NP   amoxicillin-clavulanate (AUGMENTIN) 875-125 MG tablet Take 1 tablet by mouth every 12 (twelve) hours. 14 tablet Cathlyn Parsons, NP      PDMP not reviewed this encounter.   Cathlyn Parsons, NP 03/04/21 [phone]

## 2021-02-25 NOTE — ED Triage Notes (Signed)
Pt presents with with a cough and SOB. She had the flu last week. States the cough has not gone away.

## 2022-08-23 IMAGING — CT CT RENAL STONE PROTOCOL
2 of 4 series · 16 of 46 positions shown, 18 images · non-contrast
Comparison: 1730

CLINICAL DATA: Left flank pain

EXAM:
CT ABDOMEN AND PELVIS WITHOUT CONTRAST
TECHNIQUE: Multidetector CT imaging of the abdomen and pelvis was performed
following the standard protocol without IV contrast.

[Series 3: renal stone 5.0 · axial · 0.62mm/px · z∈[+702,+1117]mm · 13 of 91 slices shown, 15 images]
[im 4/91  soft-tissue]
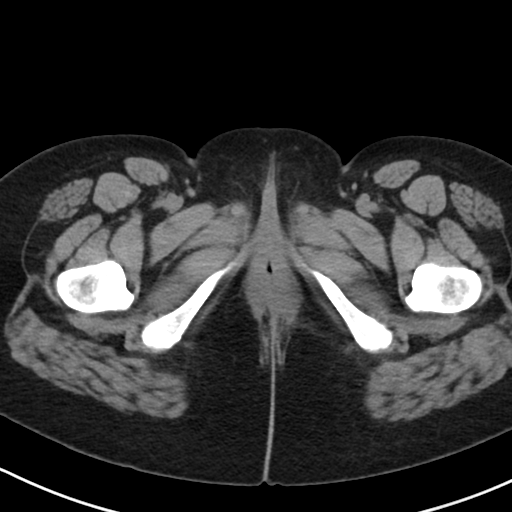
[im 4/91  bone]
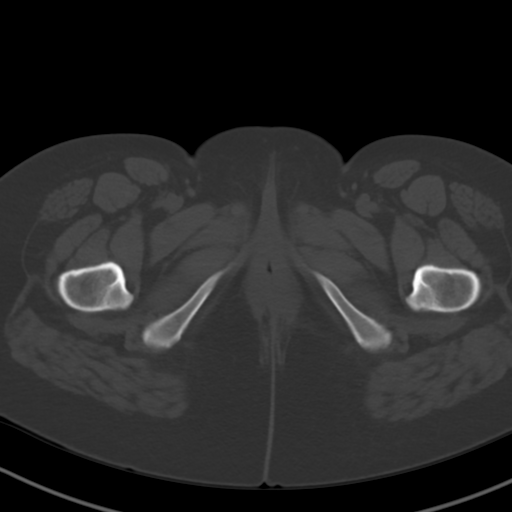
[im 12/91  soft-tissue]
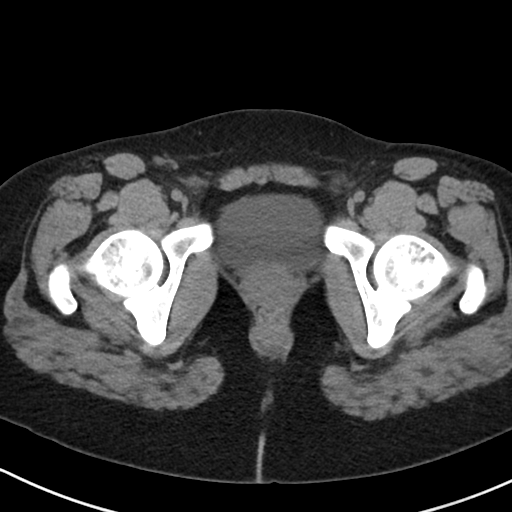
[im 19/91  soft-tissue]
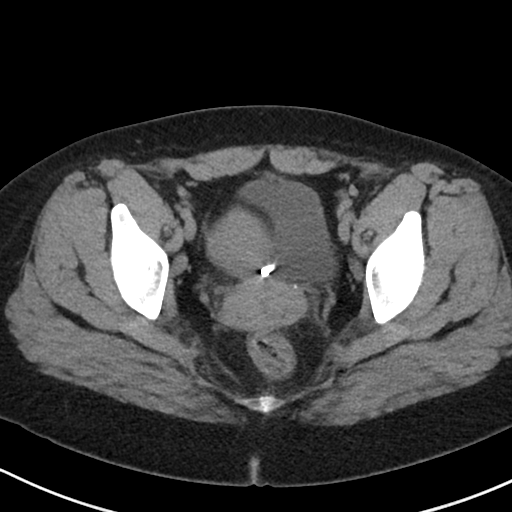
[im 27/91  soft-tissue]
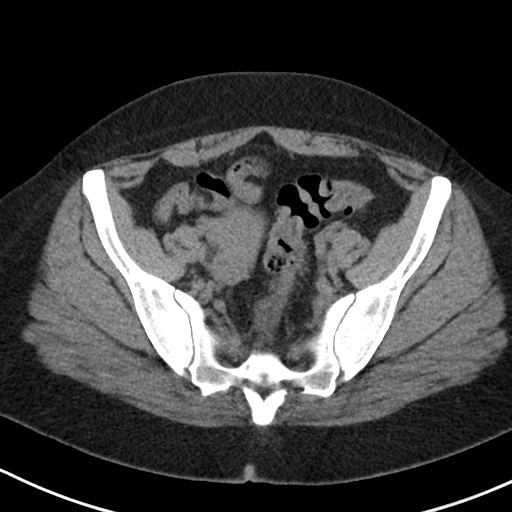
[im 31/91  soft-tissue]
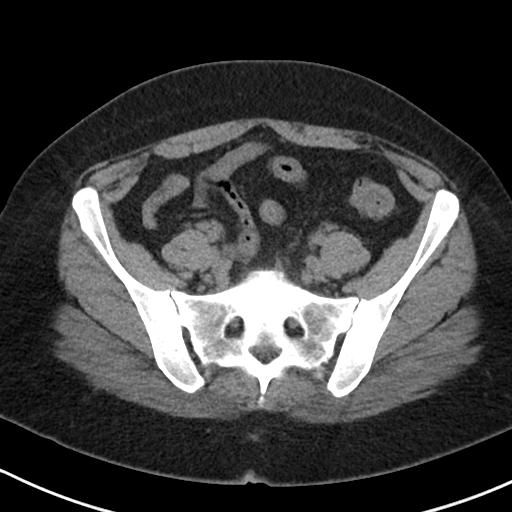
[im 38/91  soft-tissue]
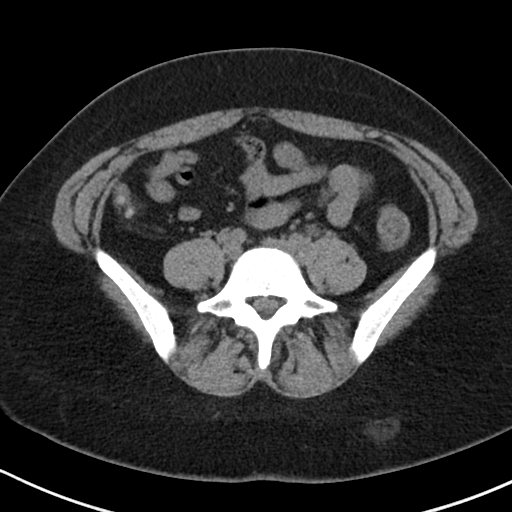
[im 46/91  soft-tissue]
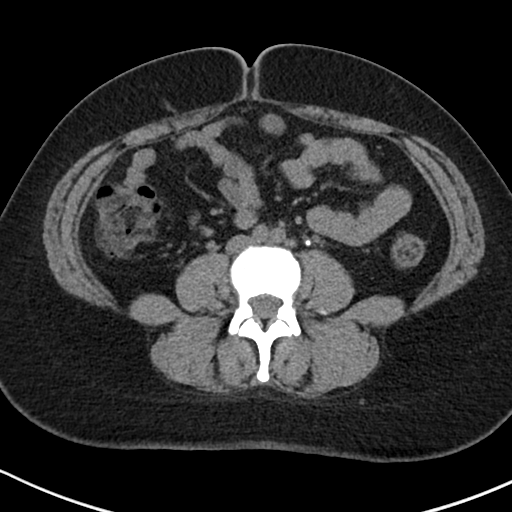
[im 53/91  soft-tissue]
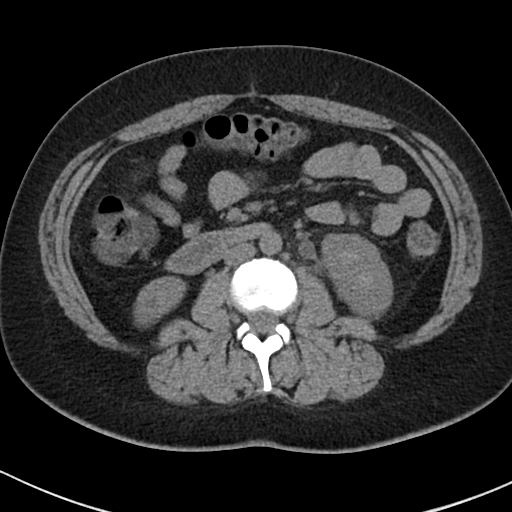
[im 61/91  soft-tissue]
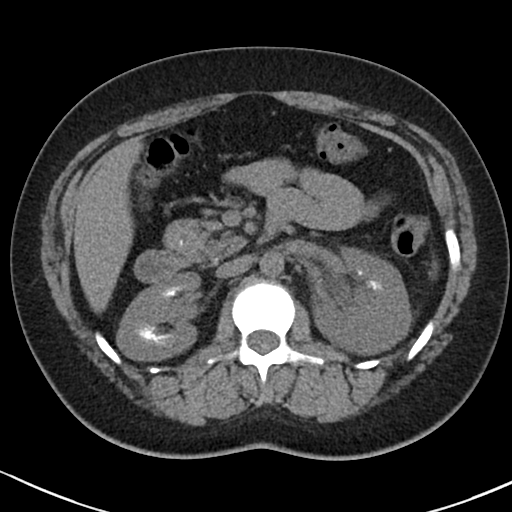
[im 61/91  bone]
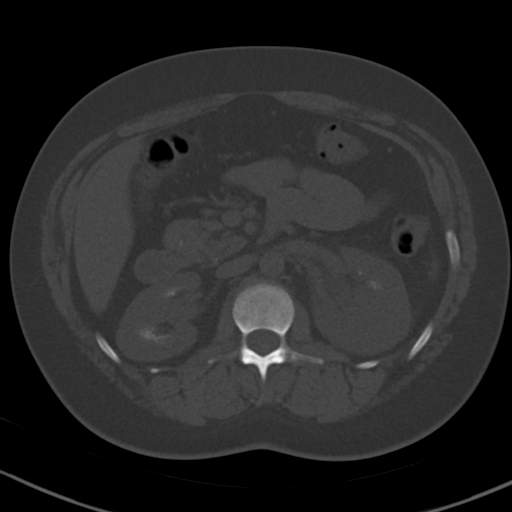
[im 64/91  soft-tissue]
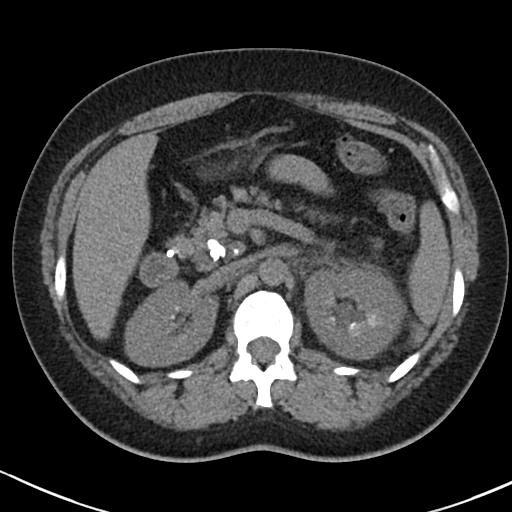
[im 72/91  soft-tissue]
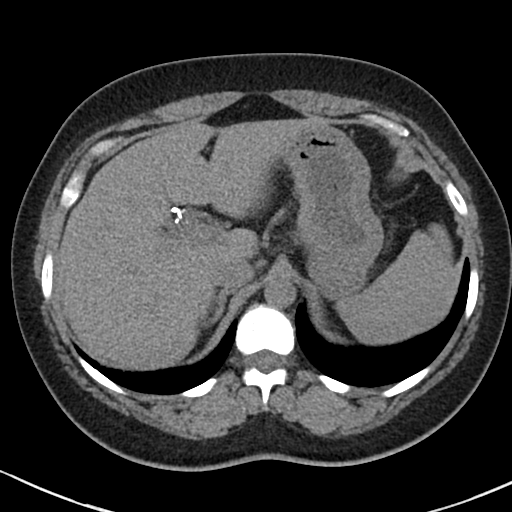
[im 79/91  soft-tissue]
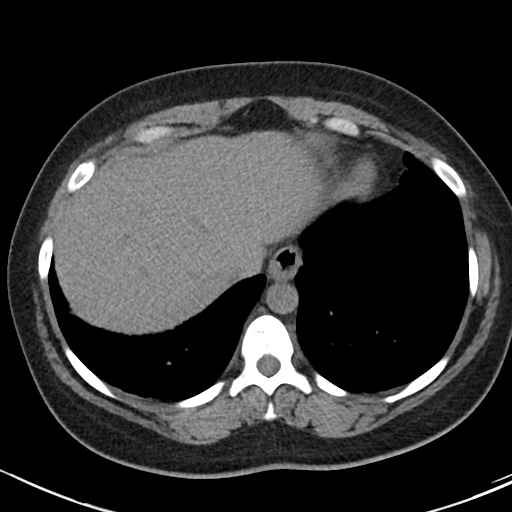
[im 87/91  soft-tissue]
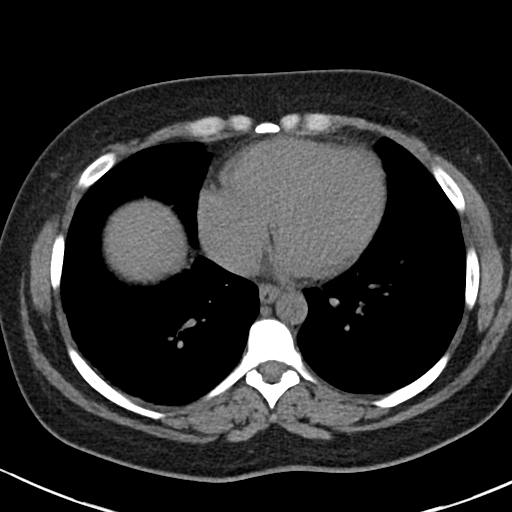

[Series 6: coronal · coronal · 0.63mm/px · 3 of 93 slices shown]
[im 31/93  soft-tissue]
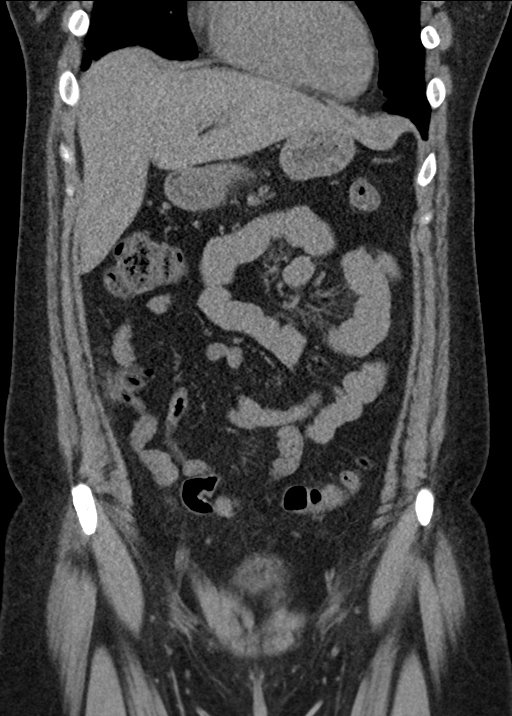
[im 41/93  soft-tissue]
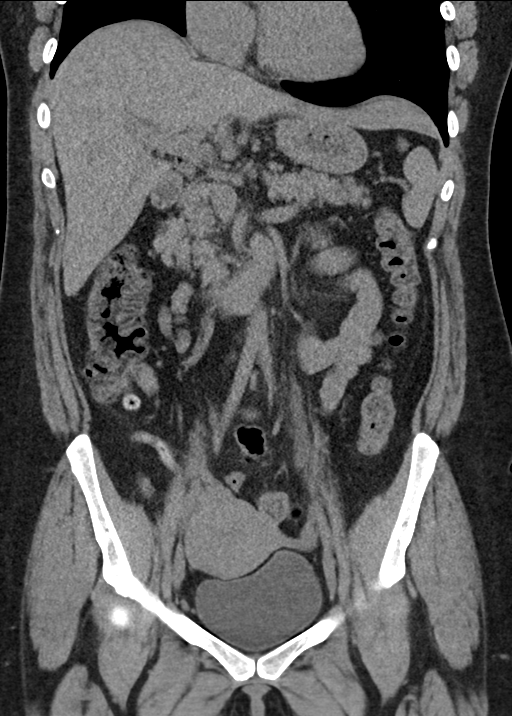
[im 52/93  soft-tissue]
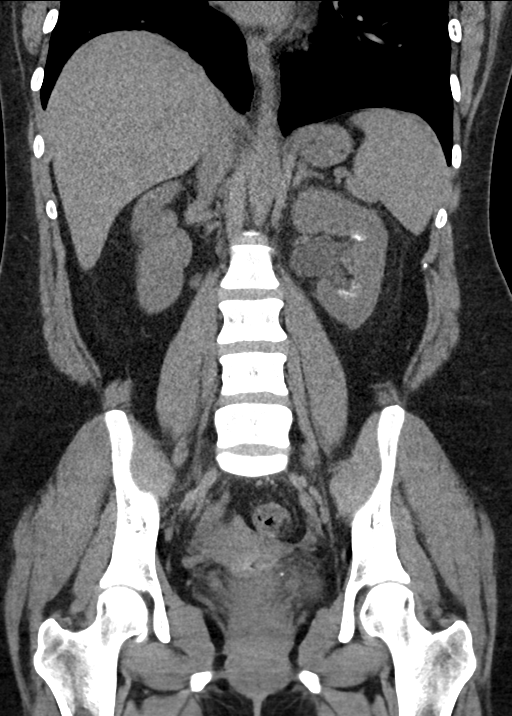

[16 of 46 positions shown; findings below may reference images not displayed]

FINDINGS: Lower chest: No acute abnormality. Right middle lobe pneumatocele is
again noted.

Hepatobiliary: No focal liver abnormality is seen. Status post
cholecystectomy. No biliary dilatation.

Pancreas: Calcifications along the head of the pancreas present
previously. New calcification along the body.

Spleen: Unremarkable.

Adrenals/Urinary Tract: Adrenals are unremarkable. Unchanged
appearance of ill-defined medullary calcification bilaterally. There
may be additional small nonobstructing renal calculi measuring up to
2 mm. There is an obstructing 3 mm calculus within the proximal left
ureter. Proximal left hydroureteronephrosis is present. Bladder is
unremarkable.

Stomach/Bowel: Stomach is within normal limits. Bowel is normal in
caliber. Normal appendix.

Vascular/Lymphatic: No significant vascular findings on this
noncontrast study. No enlarged lymph nodes.

Reproductive: Uterus and bilateral adnexa are unremarkable.

Other: No ascites.  Abdominal wall is unremarkable.

Musculoskeletal: No significant or acute osseous abnormality.
IMPRESSION: 3 mm obstructing proximal left ureteral calculus with left
hydroureteronephrosis.

Medullary nephrocalcinosis. Probable few superimposed nonobstructing
renal calculi measuring up to 2 mm.

Pancreatic calcifications. Given above, could reflect
hyperparathyroidism.

## 2023-05-18 ENCOUNTER — Ambulatory Visit
Admission: EM | Admit: 2023-05-18 | Discharge: 2023-05-18 | Disposition: A | Discharge status: Home / Self Care | Payer: Medicaid Other | Attending: Family Medicine | Admitting: Family Medicine

## 2023-05-18 DIAGNOSIS — N760 Acute vaginitis: Secondary | ICD-10-CM | POA: Diagnosis present

## 2023-05-18 NOTE — Discharge Instructions (Signed)
We will treat you as appropriate based off of positive lab results.  Our nurse will call you to follow-up with you.

## 2023-05-18 NOTE — ED Triage Notes (Signed)
Pt requesting STD testing-c/o vaginal d/c x 3 days-NAD-steady gait

## 2023-05-18 NOTE — ED Provider Notes (Signed)
[facility]  Note:  This document was prepared using Conservation officer, historic buildings and may include unintentional dictation errors.  MRN: [id number] DOB: 1981/03/24  Subjective:   Christine Bautista is a 43 y.o. female presenting for 3-day history of vaginal discharge.  No urinary symptoms.  Has a history of BV and yeast infection.  Had unprotected sex last week.  Would like to be checked for STIs.  No current facility-administered medications for this encounter.  Current Outpatient Medications:    norethindrone (AYGESTIN) 5 MG tablet, 1 po daily to suppress periods., Disp: , Rfl:    amoxicillin-clavulanate (AUGMENTIN) 875-125 MG tablet, Take 1 tablet by mouth every 12 (twelve) hours., Disp: 14 tablet, Rfl: 0   azithromycin (ZITHROMAX) 250 MG tablet, Take 1 tablet (250 mg total) by mouth daily. Take first 2 tablets together, then 1 every day until finished., Disp: 6 tablet, Rfl: 0   HYDROcodone-acetaminophen (NORCO/VICODIN) 5-325 MG tablet, Take 1 tablet by mouth every 6 (six) hours as needed for severe pain., Disp: 12 tablet, Rfl: 0   ibuprofen (ADVIL,MOTRIN) 200 MG tablet, Take 800 mg by mouth every 8 (eight) hours as needed for fever, headache, mild pain, moderate pain or cramping. , Disp: , Rfl:    meloxicam (MOBIC) 7.5 MG tablet, Take 1 tablet (7.5 mg total) by mouth daily as needed for pain., Disp: 5 tablet, Rfl: 0   ondansetron (ZOFRAN ODT) 4 MG disintegrating tablet, Take 1 tablet (4 mg total) by mouth every 8 (eight) hours as needed for nausea or vomiting., Disp: 16 tablet, Rfl: 0   SUMAtriptan (IMITREX) 100 MG tablet, Take 100 mg by mouth every 2 (two) hours as needed for migraine. , Disp: , Rfl:    topiramate (TOPAMAX) 25 MG tablet, Take 25 mg by mouth daily as needed (migraines). , Disp: , Rfl:    traMADol (ULTRAM) 50 MG tablet, Take 50 mg by mouth every 6 (six) hours as needed., Disp: , Rfl:    traMADol (ULTRAM) 50 MG tablet, Take 1 tablet (50 mg total) by  mouth every 6 (six) hours as needed., Disp: 8 tablet, Rfl: 0   No Known Allergies  Past Medical History:  Diagnosis Date   Hematuria    History of kidney stones    Left ureteral calculus    Migraines    Nocturia    Renal calculus, bilateral    Sjogren's disease (HCC)    Urgency of urination      Past Surgical History:  Procedure Laterality Date   CHOLECYSTECTOMY  2005   CYSTOSCOPY W/ URETERAL STENT PLACEMENT Left 05/05/2013   Procedure: CYSTOSCOPY WITH RETROGRADE PYELOGRAM/URETERAL STENT PLACEMENT LEFT;  Surgeon: Milford Cage, MD;  Location: [facility];  Service: Urology;  Laterality: Left;   CYSTOSCOPY WITH RETROGRADE PYELOGRAM, URETEROSCOPY AND STENT PLACEMENT Left 05/15/2013   Procedure: CYSTOSCOPY WITH RETROGRADE PYELOGRAM, URETEROSCOPY AND STENT EXCHANGE;  Surgeon: Milford Cage, MD;  Location: [facility];  Service: Urology;  Laterality: Left;   EXTRACORPOREAL SHOCK WAVE LITHOTRIPSY  2012   HOLMIUM LASER APPLICATION Left 05/15/2013   Procedure: HOLMIUM LASER APPLICATION;  Surgeon: Milford Cage, MD;  Location: [facility];  Service: Urology;  Laterality: Left;   TUBAL LIGATION  2006    Family History  Problem Relation Age of Onset   Hepatitis C Mother    COPD Mother    Hypertension Father    Hyperlipidemia Father    Asthma Brother     Social  History   Tobacco Use   Smoking status: Every Day    Types: Cigars   Smokeless tobacco: Never   Tobacco comments:    1 TO 2 CIGAR PER DAY  Vaping Use   Vaping status: Never Used  Substance Use Topics   Alcohol use: Yes    Comment: occasional   Drug use: No    ROS   Objective:   Vitals: BP 121/81 (BP Location: Left Arm)   Pulse 89   Temp 99.1 F (37.3 C) (Oral)   Resp 16   SpO2 96%   Physical Exam Constitutional:      General: She is not in acute distress.    Appearance: Normal appearance. She is well-developed. She is not ill-appearing, toxic-appearing or  diaphoretic.  HENT:     Head: Normocephalic and atraumatic.     Nose: Nose normal.     Mouth/Throat:     Mouth: Mucous membranes are moist.     Pharynx: Oropharynx is clear.  Eyes:     General: No scleral icterus.       Right eye: No discharge.        Left eye: No discharge.     Extraocular Movements: Extraocular movements intact.     Conjunctiva/sclera: Conjunctivae normal.  Cardiovascular:     Rate and Rhythm: Normal rate.  Pulmonary:     Effort: Pulmonary effort is normal.  Abdominal:     General: Bowel sounds are normal. There is no distension.     Palpations: Abdomen is soft. There is no mass.     Tenderness: There is no abdominal tenderness. There is no right CVA tenderness, left CVA tenderness, guarding or rebound.  Skin:    General: Skin is warm and dry.  Neurological:     General: No focal deficit present.     Mental Status: She is alert and oriented to person, place, and time.  Psychiatric:        Mood and Affect: Mood normal.        Behavior: Behavior normal.        Thought Content: Thought content normal.        Judgment: Judgment normal.    History tubal ligation, no UPT.  Assessment and Plan :   PDMP not reviewed this encounter.  1. Acute vaginitis    Patient declines empiric treatment.  Will base treatment off of test results.  Counseled patient on potential for adverse effects with medications prescribed/recommended today, ER and return-to-clinic precautions discussed, patient verbalized understanding.    Wallis Bamberg, [place] 05/18/23 1840

## 2023-05-19 LAB — CERVICOVAGINAL ANCILLARY ONLY
Bacterial Vaginitis (gardnerella): POSITIVE — AB
Candida Glabrata: NEGATIVE
Candida Vaginitis: POSITIVE — AB
Chlamydia: NEGATIVE
Comment: NEGATIVE
Comment: NEGATIVE
Comment: NEGATIVE
Comment: NEGATIVE
Comment: NEGATIVE
Comment: NORMAL
Neisseria Gonorrhea: NEGATIVE
Trichomonas: NEGATIVE

## 2023-05-20 ENCOUNTER — Telehealth (HOSPITAL_COMMUNITY): Payer: Self-pay

## 2023-05-20 MED ORDER — FLUCONAZOLE 150 MG PO TABS: 150.0000 mg | ORAL_TABLET | Freq: Once | ORAL | 0 refills | Status: AC

## 2023-05-20 MED ORDER — METRONIDAZOLE 500 MG PO TABS: 500.0000 mg | ORAL_TABLET | Freq: Two times a day (BID) | ORAL | 0 refills | Status: AC

## 2023-05-20 NOTE — Telephone Encounter (Signed)
Per protocol, pt requires tx with metronidazole and Diflucan.  Rx sent to pharmacy on file.

## 2023-07-22 IMAGING — DX DG CHEST 2V
2 series · 2 of 2 positions shown · non-contrast
Comparison: 07/26/2014

CLINICAL DATA: Shortness of breath, cough, flu

EXAM:
CHEST - 2 VIEW

[chest pa]
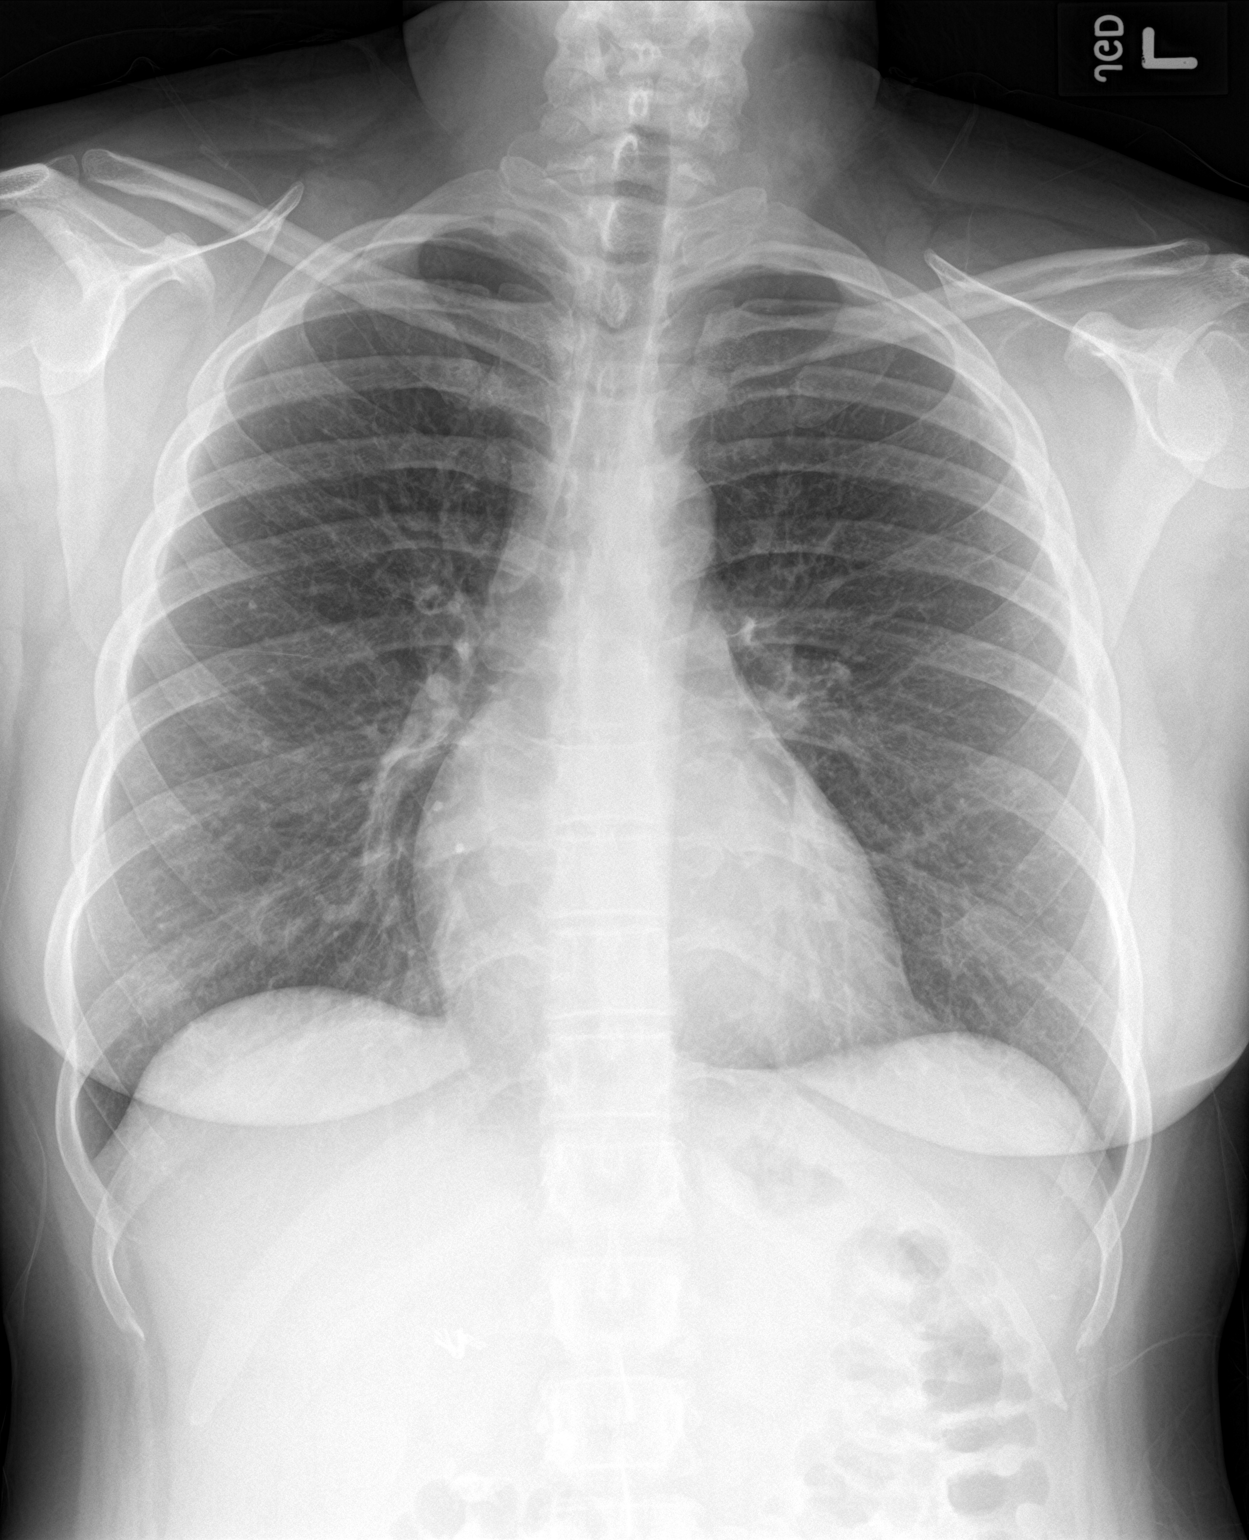

[chest lat]
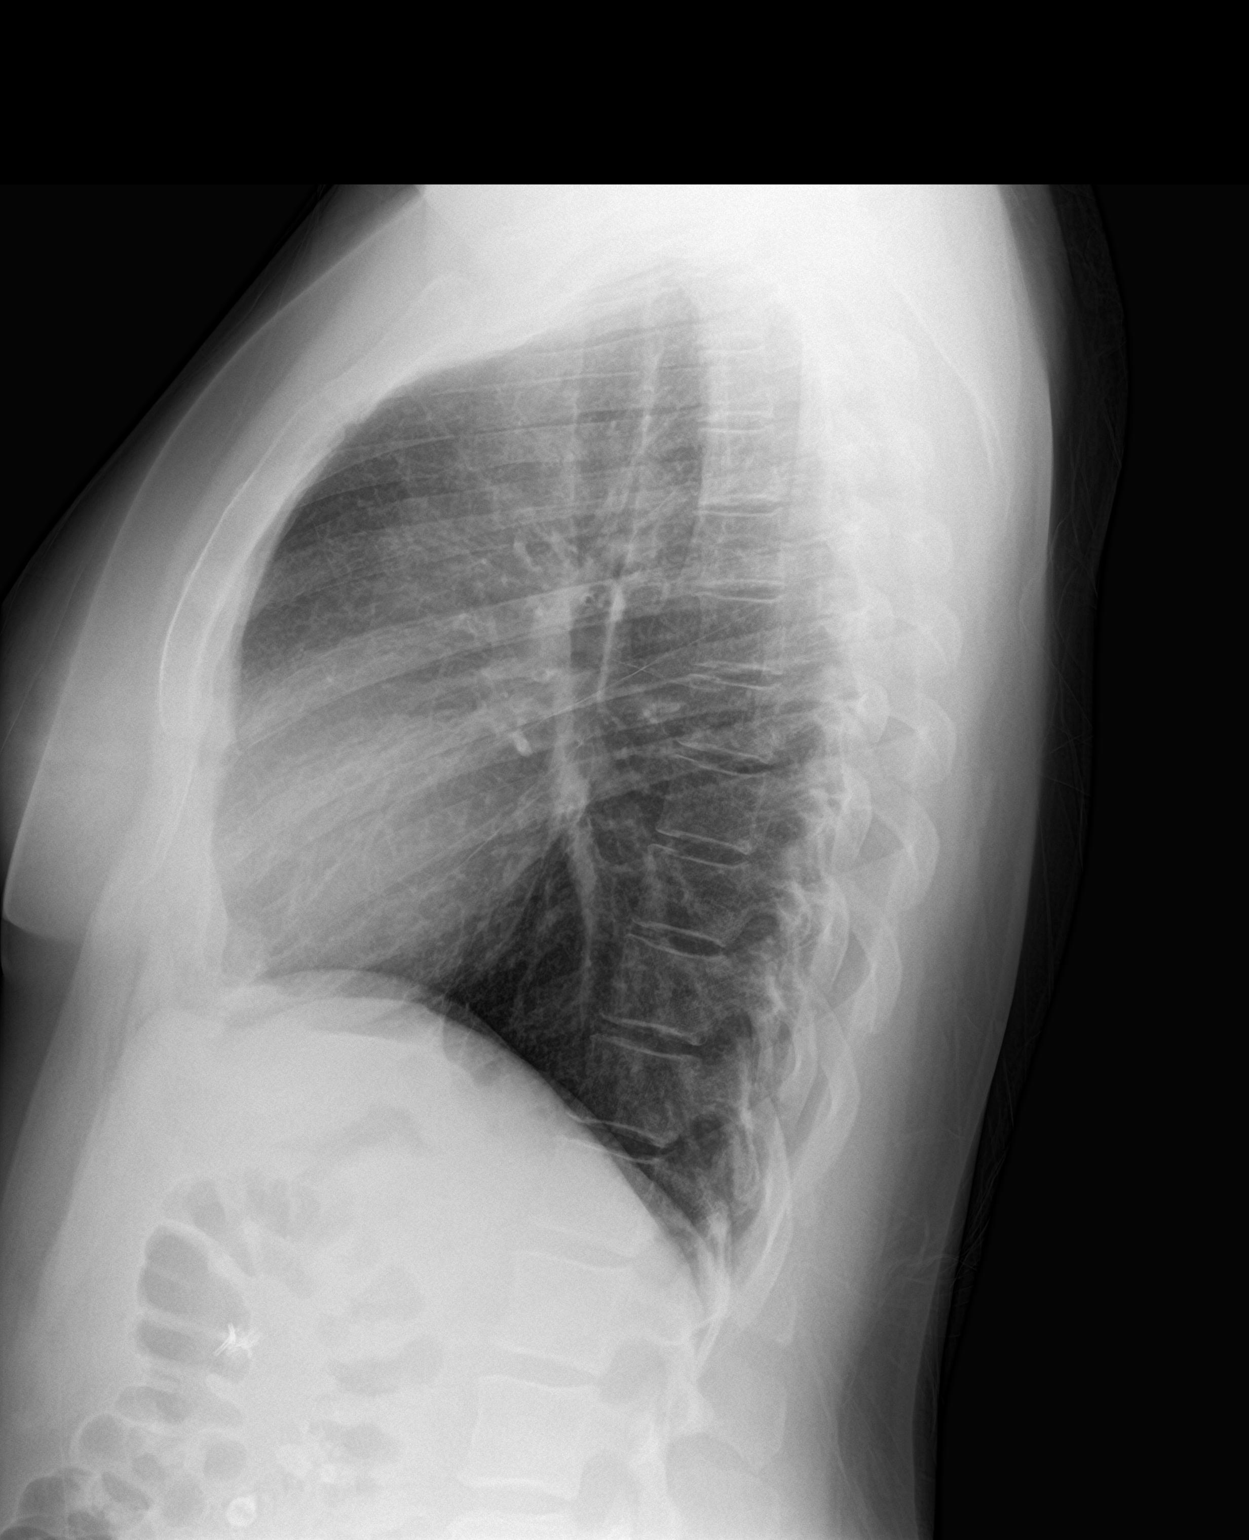

[2 of 2 positions shown; findings below may reference images not displayed]

FINDINGS: Lungs are clear.  No pleural effusion or pneumothorax.

The heart is normal in size.

Visualized osseous structures are within normal limits.

Cholecystectomy clips.
IMPRESSION: Normal chest radiographs.
